# Patient Record
Sex: Female | Born: 1965 | Hispanic: Yes | Marital: Married | Smoking: Never smoker
Health system: Southern US, Community
[De-identification: ages and names within clinical notes are randomized; demographics above are authoritative.]

## PROBLEM LIST (undated history)

## (undated) DIAGNOSIS — Z923 Personal history of irradiation: Secondary | ICD-10-CM

## (undated) DIAGNOSIS — R519 Headache, unspecified: Secondary | ICD-10-CM

## (undated) DIAGNOSIS — E282 Polycystic ovarian syndrome: Secondary | ICD-10-CM

## (undated) DIAGNOSIS — K219 Gastro-esophageal reflux disease without esophagitis: Secondary | ICD-10-CM

## (undated) HISTORY — PX: BREAST LUMPECTOMY: SHX2

## (undated) HISTORY — PX: COLONOSCOPY: SHX174

---

## 2018-09-23 DIAGNOSIS — Z23 Encounter for immunization: Secondary | ICD-10-CM | POA: Diagnosis not present

## 2018-10-18 DIAGNOSIS — Z23 Encounter for immunization: Secondary | ICD-10-CM | POA: Diagnosis not present

## 2019-03-23 DIAGNOSIS — H2513 Age-related nuclear cataract, bilateral: Secondary | ICD-10-CM | POA: Diagnosis not present

## 2019-03-23 DIAGNOSIS — H43393 Other vitreous opacities, bilateral: Secondary | ICD-10-CM | POA: Diagnosis not present

## 2019-03-23 DIAGNOSIS — H16223 Keratoconjunctivitis sicca, not specified as Sjogren's, bilateral: Secondary | ICD-10-CM | POA: Diagnosis not present

## 2019-03-23 DIAGNOSIS — H0100A Unspecified blepharitis right eye, upper and lower eyelids: Secondary | ICD-10-CM | POA: Diagnosis not present

## 2019-04-27 DIAGNOSIS — Z0000 Encounter for general adult medical examination without abnormal findings: Secondary | ICD-10-CM | POA: Diagnosis not present

## 2019-05-20 DIAGNOSIS — M5432 Sciatica, left side: Secondary | ICD-10-CM | POA: Diagnosis not present

## 2019-05-20 DIAGNOSIS — E785 Hyperlipidemia, unspecified: Secondary | ICD-10-CM | POA: Diagnosis not present

## 2019-05-20 DIAGNOSIS — Z0000 Encounter for general adult medical examination without abnormal findings: Secondary | ICD-10-CM | POA: Diagnosis not present

## 2019-12-09 DIAGNOSIS — M5432 Sciatica, left side: Secondary | ICD-10-CM | POA: Diagnosis not present

## 2019-12-31 DIAGNOSIS — M9902 Segmental and somatic dysfunction of thoracic region: Secondary | ICD-10-CM | POA: Diagnosis not present

## 2019-12-31 DIAGNOSIS — M5416 Radiculopathy, lumbar region: Secondary | ICD-10-CM | POA: Diagnosis not present

## 2019-12-31 DIAGNOSIS — M9901 Segmental and somatic dysfunction of cervical region: Secondary | ICD-10-CM | POA: Diagnosis not present

## 2019-12-31 DIAGNOSIS — M9903 Segmental and somatic dysfunction of lumbar region: Secondary | ICD-10-CM | POA: Diagnosis not present

## 2020-01-14 DIAGNOSIS — M9902 Segmental and somatic dysfunction of thoracic region: Secondary | ICD-10-CM | POA: Diagnosis not present

## 2020-01-14 DIAGNOSIS — M5416 Radiculopathy, lumbar region: Secondary | ICD-10-CM | POA: Diagnosis not present

## 2020-01-14 DIAGNOSIS — M9901 Segmental and somatic dysfunction of cervical region: Secondary | ICD-10-CM | POA: Diagnosis not present

## 2020-01-14 DIAGNOSIS — M9903 Segmental and somatic dysfunction of lumbar region: Secondary | ICD-10-CM | POA: Diagnosis not present

## 2020-01-18 DIAGNOSIS — M9903 Segmental and somatic dysfunction of lumbar region: Secondary | ICD-10-CM | POA: Diagnosis not present

## 2020-01-18 DIAGNOSIS — M9901 Segmental and somatic dysfunction of cervical region: Secondary | ICD-10-CM | POA: Diagnosis not present

## 2020-01-18 DIAGNOSIS — M5416 Radiculopathy, lumbar region: Secondary | ICD-10-CM | POA: Diagnosis not present

## 2020-01-18 DIAGNOSIS — M9902 Segmental and somatic dysfunction of thoracic region: Secondary | ICD-10-CM | POA: Diagnosis not present

## 2020-01-21 DIAGNOSIS — M9902 Segmental and somatic dysfunction of thoracic region: Secondary | ICD-10-CM | POA: Diagnosis not present

## 2020-01-21 DIAGNOSIS — M9901 Segmental and somatic dysfunction of cervical region: Secondary | ICD-10-CM | POA: Diagnosis not present

## 2020-01-21 DIAGNOSIS — M5416 Radiculopathy, lumbar region: Secondary | ICD-10-CM | POA: Diagnosis not present

## 2020-01-21 DIAGNOSIS — M9903 Segmental and somatic dysfunction of lumbar region: Secondary | ICD-10-CM | POA: Diagnosis not present

## 2020-01-25 DIAGNOSIS — M9903 Segmental and somatic dysfunction of lumbar region: Secondary | ICD-10-CM | POA: Diagnosis not present

## 2020-01-25 DIAGNOSIS — M9902 Segmental and somatic dysfunction of thoracic region: Secondary | ICD-10-CM | POA: Diagnosis not present

## 2020-01-25 DIAGNOSIS — M9901 Segmental and somatic dysfunction of cervical region: Secondary | ICD-10-CM | POA: Diagnosis not present

## 2020-01-25 DIAGNOSIS — M5416 Radiculopathy, lumbar region: Secondary | ICD-10-CM | POA: Diagnosis not present

## 2020-01-28 DIAGNOSIS — M9901 Segmental and somatic dysfunction of cervical region: Secondary | ICD-10-CM | POA: Diagnosis not present

## 2020-01-28 DIAGNOSIS — M9903 Segmental and somatic dysfunction of lumbar region: Secondary | ICD-10-CM | POA: Diagnosis not present

## 2020-01-28 DIAGNOSIS — M5416 Radiculopathy, lumbar region: Secondary | ICD-10-CM | POA: Diagnosis not present

## 2020-01-28 DIAGNOSIS — M9902 Segmental and somatic dysfunction of thoracic region: Secondary | ICD-10-CM | POA: Diagnosis not present

## 2020-02-04 DIAGNOSIS — M9903 Segmental and somatic dysfunction of lumbar region: Secondary | ICD-10-CM | POA: Diagnosis not present

## 2020-02-04 DIAGNOSIS — M5416 Radiculopathy, lumbar region: Secondary | ICD-10-CM | POA: Diagnosis not present

## 2020-02-04 DIAGNOSIS — M9901 Segmental and somatic dysfunction of cervical region: Secondary | ICD-10-CM | POA: Diagnosis not present

## 2020-02-04 DIAGNOSIS — M9902 Segmental and somatic dysfunction of thoracic region: Secondary | ICD-10-CM | POA: Diagnosis not present

## 2020-02-08 DIAGNOSIS — M9903 Segmental and somatic dysfunction of lumbar region: Secondary | ICD-10-CM | POA: Diagnosis not present

## 2020-02-08 DIAGNOSIS — M5416 Radiculopathy, lumbar region: Secondary | ICD-10-CM | POA: Diagnosis not present

## 2020-02-08 DIAGNOSIS — M9901 Segmental and somatic dysfunction of cervical region: Secondary | ICD-10-CM | POA: Diagnosis not present

## 2020-02-08 DIAGNOSIS — M9902 Segmental and somatic dysfunction of thoracic region: Secondary | ICD-10-CM | POA: Diagnosis not present

## 2020-02-11 DIAGNOSIS — M9901 Segmental and somatic dysfunction of cervical region: Secondary | ICD-10-CM | POA: Diagnosis not present

## 2020-02-11 DIAGNOSIS — M5416 Radiculopathy, lumbar region: Secondary | ICD-10-CM | POA: Diagnosis not present

## 2020-02-11 DIAGNOSIS — M9902 Segmental and somatic dysfunction of thoracic region: Secondary | ICD-10-CM | POA: Diagnosis not present

## 2020-02-11 DIAGNOSIS — M9903 Segmental and somatic dysfunction of lumbar region: Secondary | ICD-10-CM | POA: Diagnosis not present

## 2020-02-12 DIAGNOSIS — M5442 Lumbago with sciatica, left side: Secondary | ICD-10-CM | POA: Diagnosis not present

## 2020-02-15 DIAGNOSIS — M9902 Segmental and somatic dysfunction of thoracic region: Secondary | ICD-10-CM | POA: Diagnosis not present

## 2020-02-15 DIAGNOSIS — M9903 Segmental and somatic dysfunction of lumbar region: Secondary | ICD-10-CM | POA: Diagnosis not present

## 2020-02-15 DIAGNOSIS — M5416 Radiculopathy, lumbar region: Secondary | ICD-10-CM | POA: Diagnosis not present

## 2020-02-15 DIAGNOSIS — M9901 Segmental and somatic dysfunction of cervical region: Secondary | ICD-10-CM | POA: Diagnosis not present

## 2020-02-18 DIAGNOSIS — M5416 Radiculopathy, lumbar region: Secondary | ICD-10-CM | POA: Diagnosis not present

## 2020-02-18 DIAGNOSIS — M9903 Segmental and somatic dysfunction of lumbar region: Secondary | ICD-10-CM | POA: Diagnosis not present

## 2020-02-18 DIAGNOSIS — M9902 Segmental and somatic dysfunction of thoracic region: Secondary | ICD-10-CM | POA: Diagnosis not present

## 2020-02-18 DIAGNOSIS — M9901 Segmental and somatic dysfunction of cervical region: Secondary | ICD-10-CM | POA: Diagnosis not present

## 2020-02-22 DIAGNOSIS — M5416 Radiculopathy, lumbar region: Secondary | ICD-10-CM | POA: Diagnosis not present

## 2020-02-22 DIAGNOSIS — M9901 Segmental and somatic dysfunction of cervical region: Secondary | ICD-10-CM | POA: Diagnosis not present

## 2020-02-22 DIAGNOSIS — M9903 Segmental and somatic dysfunction of lumbar region: Secondary | ICD-10-CM | POA: Diagnosis not present

## 2020-02-22 DIAGNOSIS — M9902 Segmental and somatic dysfunction of thoracic region: Secondary | ICD-10-CM | POA: Diagnosis not present

## 2020-02-25 DIAGNOSIS — M9903 Segmental and somatic dysfunction of lumbar region: Secondary | ICD-10-CM | POA: Diagnosis not present

## 2020-02-25 DIAGNOSIS — M9902 Segmental and somatic dysfunction of thoracic region: Secondary | ICD-10-CM | POA: Diagnosis not present

## 2020-02-25 DIAGNOSIS — M5416 Radiculopathy, lumbar region: Secondary | ICD-10-CM | POA: Diagnosis not present

## 2020-02-25 DIAGNOSIS — M9901 Segmental and somatic dysfunction of cervical region: Secondary | ICD-10-CM | POA: Diagnosis not present

## 2020-02-27 DIAGNOSIS — M5432 Sciatica, left side: Secondary | ICD-10-CM | POA: Diagnosis not present

## 2020-02-27 DIAGNOSIS — M545 Low back pain, unspecified: Secondary | ICD-10-CM | POA: Diagnosis not present

## 2020-03-03 DIAGNOSIS — M9903 Segmental and somatic dysfunction of lumbar region: Secondary | ICD-10-CM | POA: Diagnosis not present

## 2020-03-03 DIAGNOSIS — M9902 Segmental and somatic dysfunction of thoracic region: Secondary | ICD-10-CM | POA: Diagnosis not present

## 2020-03-03 DIAGNOSIS — M9901 Segmental and somatic dysfunction of cervical region: Secondary | ICD-10-CM | POA: Diagnosis not present

## 2020-03-03 DIAGNOSIS — M5416 Radiculopathy, lumbar region: Secondary | ICD-10-CM | POA: Diagnosis not present

## 2020-03-03 DIAGNOSIS — M5136 Other intervertebral disc degeneration, lumbar region: Secondary | ICD-10-CM | POA: Diagnosis not present

## 2020-03-07 DIAGNOSIS — M9901 Segmental and somatic dysfunction of cervical region: Secondary | ICD-10-CM | POA: Diagnosis not present

## 2020-03-07 DIAGNOSIS — M9902 Segmental and somatic dysfunction of thoracic region: Secondary | ICD-10-CM | POA: Diagnosis not present

## 2020-03-07 DIAGNOSIS — M9903 Segmental and somatic dysfunction of lumbar region: Secondary | ICD-10-CM | POA: Diagnosis not present

## 2020-03-07 DIAGNOSIS — M5416 Radiculopathy, lumbar region: Secondary | ICD-10-CM | POA: Diagnosis not present

## 2020-03-10 DIAGNOSIS — M9901 Segmental and somatic dysfunction of cervical region: Secondary | ICD-10-CM | POA: Diagnosis not present

## 2020-03-10 DIAGNOSIS — M9902 Segmental and somatic dysfunction of thoracic region: Secondary | ICD-10-CM | POA: Diagnosis not present

## 2020-03-10 DIAGNOSIS — M9903 Segmental and somatic dysfunction of lumbar region: Secondary | ICD-10-CM | POA: Diagnosis not present

## 2020-03-10 DIAGNOSIS — M5416 Radiculopathy, lumbar region: Secondary | ICD-10-CM | POA: Diagnosis not present

## 2020-03-14 DIAGNOSIS — M9903 Segmental and somatic dysfunction of lumbar region: Secondary | ICD-10-CM | POA: Diagnosis not present

## 2020-03-14 DIAGNOSIS — M9901 Segmental and somatic dysfunction of cervical region: Secondary | ICD-10-CM | POA: Diagnosis not present

## 2020-03-14 DIAGNOSIS — M9902 Segmental and somatic dysfunction of thoracic region: Secondary | ICD-10-CM | POA: Diagnosis not present

## 2020-03-14 DIAGNOSIS — M5416 Radiculopathy, lumbar region: Secondary | ICD-10-CM | POA: Diagnosis not present

## 2020-03-17 DIAGNOSIS — M9901 Segmental and somatic dysfunction of cervical region: Secondary | ICD-10-CM | POA: Diagnosis not present

## 2020-03-17 DIAGNOSIS — M5416 Radiculopathy, lumbar region: Secondary | ICD-10-CM | POA: Diagnosis not present

## 2020-03-17 DIAGNOSIS — M9902 Segmental and somatic dysfunction of thoracic region: Secondary | ICD-10-CM | POA: Diagnosis not present

## 2020-03-17 DIAGNOSIS — M9903 Segmental and somatic dysfunction of lumbar region: Secondary | ICD-10-CM | POA: Diagnosis not present

## 2020-03-24 DIAGNOSIS — M5416 Radiculopathy, lumbar region: Secondary | ICD-10-CM | POA: Diagnosis not present

## 2020-03-24 DIAGNOSIS — M9902 Segmental and somatic dysfunction of thoracic region: Secondary | ICD-10-CM | POA: Diagnosis not present

## 2020-03-24 DIAGNOSIS — M9903 Segmental and somatic dysfunction of lumbar region: Secondary | ICD-10-CM | POA: Diagnosis not present

## 2020-03-24 DIAGNOSIS — M9901 Segmental and somatic dysfunction of cervical region: Secondary | ICD-10-CM | POA: Diagnosis not present

## 2020-03-28 DIAGNOSIS — M9901 Segmental and somatic dysfunction of cervical region: Secondary | ICD-10-CM | POA: Diagnosis not present

## 2020-03-28 DIAGNOSIS — M5416 Radiculopathy, lumbar region: Secondary | ICD-10-CM | POA: Diagnosis not present

## 2020-03-28 DIAGNOSIS — M9903 Segmental and somatic dysfunction of lumbar region: Secondary | ICD-10-CM | POA: Diagnosis not present

## 2020-03-28 DIAGNOSIS — M9902 Segmental and somatic dysfunction of thoracic region: Secondary | ICD-10-CM | POA: Diagnosis not present

## 2020-04-04 DIAGNOSIS — M9902 Segmental and somatic dysfunction of thoracic region: Secondary | ICD-10-CM | POA: Diagnosis not present

## 2020-04-04 DIAGNOSIS — M9901 Segmental and somatic dysfunction of cervical region: Secondary | ICD-10-CM | POA: Diagnosis not present

## 2020-04-04 DIAGNOSIS — M9903 Segmental and somatic dysfunction of lumbar region: Secondary | ICD-10-CM | POA: Diagnosis not present

## 2020-04-04 DIAGNOSIS — M5416 Radiculopathy, lumbar region: Secondary | ICD-10-CM | POA: Diagnosis not present

## 2020-05-02 DIAGNOSIS — M9901 Segmental and somatic dysfunction of cervical region: Secondary | ICD-10-CM | POA: Diagnosis not present

## 2020-05-02 DIAGNOSIS — M9902 Segmental and somatic dysfunction of thoracic region: Secondary | ICD-10-CM | POA: Diagnosis not present

## 2020-05-02 DIAGNOSIS — M9903 Segmental and somatic dysfunction of lumbar region: Secondary | ICD-10-CM | POA: Diagnosis not present

## 2020-05-02 DIAGNOSIS — M5416 Radiculopathy, lumbar region: Secondary | ICD-10-CM | POA: Diagnosis not present

## 2020-05-09 DIAGNOSIS — M9902 Segmental and somatic dysfunction of thoracic region: Secondary | ICD-10-CM | POA: Diagnosis not present

## 2020-05-09 DIAGNOSIS — M9901 Segmental and somatic dysfunction of cervical region: Secondary | ICD-10-CM | POA: Diagnosis not present

## 2020-05-09 DIAGNOSIS — M5416 Radiculopathy, lumbar region: Secondary | ICD-10-CM | POA: Diagnosis not present

## 2020-05-09 DIAGNOSIS — M9903 Segmental and somatic dysfunction of lumbar region: Secondary | ICD-10-CM | POA: Diagnosis not present

## 2020-05-11 DIAGNOSIS — M5416 Radiculopathy, lumbar region: Secondary | ICD-10-CM | POA: Diagnosis not present

## 2020-05-11 DIAGNOSIS — G43009 Migraine without aura, not intractable, without status migrainosus: Secondary | ICD-10-CM | POA: Diagnosis not present

## 2020-06-09 DIAGNOSIS — U071 COVID-19: Secondary | ICD-10-CM | POA: Diagnosis not present

## 2020-06-09 DIAGNOSIS — R059 Cough, unspecified: Secondary | ICD-10-CM | POA: Diagnosis not present

## 2020-06-27 DIAGNOSIS — H16223 Keratoconjunctivitis sicca, not specified as Sjogren's, bilateral: Secondary | ICD-10-CM | POA: Diagnosis not present

## 2020-06-27 DIAGNOSIS — H5203 Hypermetropia, bilateral: Secondary | ICD-10-CM | POA: Diagnosis not present

## 2020-06-27 DIAGNOSIS — H43393 Other vitreous opacities, bilateral: Secondary | ICD-10-CM | POA: Diagnosis not present

## 2020-06-27 DIAGNOSIS — H524 Presbyopia: Secondary | ICD-10-CM | POA: Diagnosis not present

## 2020-06-27 DIAGNOSIS — H2513 Age-related nuclear cataract, bilateral: Secondary | ICD-10-CM | POA: Diagnosis not present

## 2020-06-27 DIAGNOSIS — H52203 Unspecified astigmatism, bilateral: Secondary | ICD-10-CM | POA: Diagnosis not present

## 2020-07-01 DIAGNOSIS — R0789 Other chest pain: Secondary | ICD-10-CM | POA: Diagnosis not present

## 2020-07-01 DIAGNOSIS — Z13220 Encounter for screening for lipoid disorders: Secondary | ICD-10-CM | POA: Diagnosis not present

## 2020-07-01 DIAGNOSIS — Z0000 Encounter for general adult medical examination without abnormal findings: Secondary | ICD-10-CM | POA: Diagnosis not present

## 2020-07-26 ENCOUNTER — Other Ambulatory Visit: Payer: Self-pay | Admitting: Family Medicine

## 2020-07-26 DIAGNOSIS — Z1231 Encounter for screening mammogram for malignant neoplasm of breast: Secondary | ICD-10-CM

## 2020-09-16 ENCOUNTER — Other Ambulatory Visit: Payer: Self-pay

## 2020-09-16 ENCOUNTER — Ambulatory Visit
Admission: RE | Admit: 2020-09-16 | Discharge: 2020-09-16 | Disposition: A | Discharge status: Home / Self Care | Payer: Self-pay | Source: Ambulatory Visit | Attending: Family Medicine | Admitting: Family Medicine

## 2020-09-16 DIAGNOSIS — Z1231 Encounter for screening mammogram for malignant neoplasm of breast: Secondary | ICD-10-CM

## 2020-09-21 ENCOUNTER — Other Ambulatory Visit: Payer: Self-pay | Admitting: Family Medicine

## 2020-09-21 ENCOUNTER — Other Ambulatory Visit: Payer: Self-pay | Admitting: Internal Medicine

## 2020-09-21 DIAGNOSIS — R928 Other abnormal and inconclusive findings on diagnostic imaging of breast: Secondary | ICD-10-CM

## 2020-11-21 ENCOUNTER — Other Ambulatory Visit: Payer: Self-pay

## 2020-12-15 ENCOUNTER — Other Ambulatory Visit: Payer: Self-pay

## 2021-01-18 ENCOUNTER — Other Ambulatory Visit: Payer: Self-pay | Admitting: Family Medicine

## 2021-01-18 ENCOUNTER — Ambulatory Visit
Admission: RE | Admit: 2021-01-18 | Discharge: 2021-01-18 | Disposition: A | Discharge status: Home / Self Care | Payer: Self-pay | Source: Ambulatory Visit | Attending: Family Medicine | Admitting: Family Medicine

## 2021-01-18 ENCOUNTER — Ambulatory Visit
Admission: RE | Admit: 2021-01-18 | Discharge: 2021-01-18 | Disposition: A | Discharge status: Home / Self Care | Payer: 59 | Source: Ambulatory Visit | Attending: Family Medicine | Admitting: Family Medicine

## 2021-01-18 DIAGNOSIS — N6489 Other specified disorders of breast: Secondary | ICD-10-CM

## 2021-01-18 DIAGNOSIS — R928 Other abnormal and inconclusive findings on diagnostic imaging of breast: Secondary | ICD-10-CM

## 2021-01-18 DIAGNOSIS — R921 Mammographic calcification found on diagnostic imaging of breast: Secondary | ICD-10-CM

## 2021-01-26 ENCOUNTER — Ambulatory Visit
Admission: RE | Admit: 2021-01-26 | Discharge: 2021-01-26 | Disposition: A | Discharge status: Home / Self Care | Payer: 59 | Source: Ambulatory Visit | Attending: Family Medicine | Admitting: Family Medicine

## 2021-01-26 DIAGNOSIS — R928 Other abnormal and inconclusive findings on diagnostic imaging of breast: Secondary | ICD-10-CM

## 2021-01-26 DIAGNOSIS — R921 Mammographic calcification found on diagnostic imaging of breast: Secondary | ICD-10-CM

## 2021-01-26 DIAGNOSIS — N6489 Other specified disorders of breast: Secondary | ICD-10-CM

## 2021-01-26 HISTORY — PX: BREAST BIOPSY: SHX20

## 2021-02-01 ENCOUNTER — Encounter: Payer: Self-pay | Admitting: Adult Health

## 2021-02-01 DIAGNOSIS — C50912 Malignant neoplasm of unspecified site of left female breast: Secondary | ICD-10-CM | POA: Insufficient documentation

## 2021-02-03 DIAGNOSIS — C801 Malignant (primary) neoplasm, unspecified: Secondary | ICD-10-CM

## 2021-02-03 HISTORY — DX: Malignant (primary) neoplasm, unspecified: C80.1

## 2021-02-07 ENCOUNTER — Telehealth: Payer: Self-pay | Admitting: Hematology and Oncology

## 2021-02-07 NOTE — Telephone Encounter (Signed)
Scheduled appt per 1/20 referral. Pt is aware of appt date and time. Pt is aware to arrive 15 mins prior to appt time.

## 2021-02-08 ENCOUNTER — Ambulatory Visit: Payer: Self-pay | Admitting: General Surgery

## 2021-02-08 ENCOUNTER — Other Ambulatory Visit: Payer: Self-pay | Admitting: *Deleted

## 2021-02-08 DIAGNOSIS — D0512 Intraductal carcinoma in situ of left breast: Secondary | ICD-10-CM

## 2021-02-09 ENCOUNTER — Telehealth: Payer: Self-pay | Admitting: Radiation Oncology

## 2021-02-09 ENCOUNTER — Other Ambulatory Visit: Payer: Self-pay | Admitting: General Surgery

## 2021-02-09 DIAGNOSIS — D0512 Intraductal carcinoma in situ of left breast: Secondary | ICD-10-CM

## 2021-02-09 NOTE — Telephone Encounter (Signed)
Called patient to schedule a consultation w. Dr. Squire. No answer, LVM for a return call.  

## 2021-02-10 ENCOUNTER — Telehealth: Payer: Self-pay | Admitting: Radiation Oncology

## 2021-02-10 NOTE — Telephone Encounter (Signed)
Called patient to schedule a consultation w. Dr. Squire. No answer, LVM for a return call.  

## 2021-02-13 ENCOUNTER — Telehealth: Payer: Self-pay | Admitting: Radiation Oncology

## 2021-02-13 NOTE — Telephone Encounter (Signed)
Called patient to schedule a consultation w. Dr. Squire. No answer, LVM for a return call.  

## 2021-02-13 NOTE — Telephone Encounter (Signed)
Called spouse regarding the patient. No answer, LVM for a return call to schedule a consultation w. Dr. Isidore Moos.

## 2021-02-15 ENCOUNTER — Encounter (HOSPITAL_BASED_OUTPATIENT_CLINIC_OR_DEPARTMENT_OTHER): Payer: Self-pay | Admitting: General Surgery

## 2021-02-16 ENCOUNTER — Encounter (HOSPITAL_BASED_OUTPATIENT_CLINIC_OR_DEPARTMENT_OTHER): Payer: Self-pay | Admitting: General Surgery

## 2021-02-16 ENCOUNTER — Other Ambulatory Visit: Payer: Self-pay

## 2021-02-17 MED ORDER — CHLORHEXIDINE GLUCONATE CLOTH 2 % EX PADS: 6.0000 | MEDICATED_PAD | Freq: Once | CUTANEOUS | Status: DC

## 2021-02-17 NOTE — Progress Notes (Signed)
° ° ° ° °  Enhanced Recovery after Surgery for Orthopedics Enhanced Recovery after Surgery is a protocol used to improve the stress on your body and your recovery after surgery.  Patient Instructions  The night before surgery:  No food after midnight. ONLY clear liquids after midnight  The day of surgery (if you do NOT have diabetes):  Drink ONE (1) Pre-Surgery Clear Ensure as directed.   This drink was given to you during your hospital  pre-op appointment visit. The pre-op nurse will instruct you on the time to drink the  Pre-Surgery Ensure depending on your surgery time. Finish the drink at the designated time by the pre-op nurse.  Nothing else to drink after completing the  Pre-Surgery Clear Ensure.  The day of surgery (if you have diabetes): Drink ONE (1) Gatorade 2 (G2) as directed. This drink was given to you during your hospital  pre-op appointment visit.  The pre-op nurse will instruct you on the time to drink the   Gatorade 2 (G2) depending on your surgery time. Color of the Gatorade may vary. Red is not allowed. Nothing else to drink after completing the  Gatorade 2 (G2).         If you have questions, please contact your surgeons office.   Patient given soap and instructions, verbalized understanding.

## 2021-02-20 NOTE — Progress Notes (Signed)
Radiation Oncology         [phone]) [phone] ________________________________  Name: Frances Patton        MRN: [id number]  Date of Service: 02/22/2021 DOB: 1966/01/07  QA:[id number], Anastasia Pall, MD  Jovita Kussmaul, MD     REFERRING PHYSICIAN: Autumn Messing III, MD   DIAGNOSIS: The encounter diagnosis was Malignant neoplasm of left breast in female, estrogen receptor positive, unspecified site of breast ([place]).   HISTORY OF PRESENT ILLNESS: Frances Patton is a 56 y.o. female seen for a new diagnosis of left breast cancer. She had screening mammogram in September 2022 that showed calcifications in the left breast; no abnormality was seen in the right breast.  She returned for diagnostic imaging on 01/18/2021.  The patient was found to have an area of amorphous calcifications in the lateral left breast measuring up to 1.4 cm.  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  She is scheduled to undergo left lumpectomy on 02/24/2021 and is seen to discuss treatment recommendations of her cancer.    PREVIOUS RADIATION THERAPY: No   PAST MEDICAL HISTORY:  Past Medical History:  Diagnosis Date   Cancer ([place]) 02/03/2021   Left breast DCIS   GERD (gastroesophageal reflux disease)    Headache    PCOS (polycystic ovarian syndrome)        PAST SURGICAL HISTORY: Past Surgical History:  Procedure Laterality Date   BREAST BIOPSY Left 01/26/2021   stereo biopsy/ x clip/ path pending   COLONOSCOPY       FAMILY HISTORY:  Family History  Problem Relation Age of Onset   Breast cancer Neg Hx      SOCIAL HISTORY:  reports that she has never smoked. She has never used smokeless tobacco. She reports that she does not drink alcohol and does not use drugs.  The patient is married and lives in [place].  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 She's originally from the [place]    ALLERGIES: Latex   MEDICATIONS:  Current Outpatient Medications  Medication Sig Dispense Refill   b complex vitamins capsule Take by mouth daily.     magnesium 30 MG tablet Take by mouth 2 (two) times daily.     pyridOXINE (VITAMIN B-6) 100 MG tablet Take 100 mg by mouth daily.     VITAMIN D PO Take by mouth.     No current facility-administered medications for this visit.   Facility-Administered Medications Ordered in Other Visits  Medication Dose Route Frequency Provider Last Rate Last Admin   Chlorhexidine Gluconate Cloth 2 % PADS 6 each  6 each Topical Once Autumn Messing III, MD       And   Chlorhexidine Gluconate Cloth 2 % PADS 6 each  6 each Topical Once Autumn Messing III, MD         REVIEW OF SYSTEMS: On review of systems, the patient reports that she is doing okay but has had some soreness in her breast since her biopsy.     PHYSICAL EXAM:  Wt Readings from Last 3 Encounters:  No data found for Wt   Temp Readings from Last 3 Encounters:  No data found for Temp   BP Readings from Last 3 Encounters:  No data found for BP   Pulse Readings from Last 3 Encounters:  No data found for  Pulse    In general this is a well appearing Asian female in no acute distress. She's alert and oriented x4 and appropriate throughout the examination. Cardiopulmonary assessment is negative for acute distress and she exhibits normal effort. Bilateral breast exam is deferred.    ECOG = 0  0 - Asymptomatic (Fully active, able to carry on all predisease activities without restriction)  1 - Symptomatic but completely ambulatory (Restricted in physically strenuous activity but ambulatory and able to carry out work of a light or sedentary nature. For example, light housework, office work)  2 - Symptomatic, <50% in bed during the day (Ambulatory and capable of all self care but unable to carry out any work activities. Up  and about more than 50% of waking hours)  3 - Symptomatic, >50% in bed, but not bedbound (Capable of only limited self-care, confined to bed or chair 50% or more of waking hours)  4 - Bedbound (Completely disabled. Cannot carry on any self-care. Totally confined to bed or chair)  5 - Death   Eustace Pen MM, Creech RH, Tormey DC, et al. [phone]). "Toxicity and response criteria of the [facility]". [place] Oncol. 5 (6): 649-55    LABORATORY DATA:  No results found for: WBC, HGB, HCT, MCV, PLT No results found for: NA, K, CL, CO2 No results found for: ALT, AST, GGT, ALKPHOS, BILITOT    RADIOGRAPHY: MM CLIP PLACEMENT LEFT  Result Date: 01/26/2021 CLINICAL DATA:  Post stereotactic guided biopsy of calcifications with associated distortion in the lower outer left breast. EXAM: 3D DIAGNOSTIC LEFT MAMMOGRAM POST STEREOTACTIC BIOPSY COMPARISON:  Previous exam(s). FINDINGS: 3D Mammographic images were obtained following stereotactic guided biopsy of calcifications with associated distortion in the lower outer left breast. An X shaped biopsy marking clip is present at the site of the biopsied calcifications with associated distortion in the lower outer left breast. A small post biopsy hematoma is present. IMPRESSION: 1. X shaped biopsy marking clip at site of biopsied calcifications with associated distortion in the lower outer left breast. 2.  Small post biopsy hematoma. Final Assessment: Post Procedure Mammograms for Marker Placement Electronically Signed   By: Everlean Alstrom M.D.   On: 01/26/2021 11:53  MM LT BREAST BX W LOC DEV 1ST LESION IMAGE BX SPEC STEREO GUIDE  Addendum Date: 01/31/2021   ADDENDUM REPORT: 01/31/2021 13:27 ADDENDUM: Pathology revealed INTERMEDIATE GRADE DUCTAL CARCINOMA IN SITU with CENTRAL (COMEDO) NECROSIS AND CALCIFICATIONS of the LEFT breast, lateral, (x clip). This was found to be concordant by Dr. Everlean Alstrom. Pathology results were discussed with  the patient by telephone. The patient reported doing well after the biopsy with tenderness at the site. Post biopsy instructions and care were reviewed and questions were answered. The patient was encouraged to call [facility] for any additional concerns. My direct phone number was provided. Surgical consultation has been arranged with Dr. Autumn Messing at [facility] on February 01, 2021. Pathology results reported by Terie Purser, RN on 01/31/2021. Electronically Signed   By: Everlean Alstrom M.D.   On: 01/31/2021 13:27   Result Date: 01/31/2021 CLINICAL DATA:  56 year old female presents for stereotactic guided biopsy of a 1.4 cm group of calcifications with associated distortion in the lateral left breast. EXAM: LEFT BREAST STEREOTACTIC CORE NEEDLE BIOPSY COMPARISON:  Previous exams. FINDINGS: The patient and I discussed the procedure of stereotactic-guided biopsy including benefits and alternatives. We discussed the high likelihood of a successful procedure.  We discussed the risks of the procedure including infection, bleeding, tissue injury, clip migration, and inadequate sampling. Informed written consent was given. The usual time out protocol was performed immediately prior to the procedure. Using sterile technique and 1% Lidocaine as local anesthetic, under stereotactic guidance, a 9 gauge vacuum assisted device was used to perform core needle biopsy of the calcifications with associated distortion in the lateral left breast using a lateral to medial approach. Specimen radiograph was performed showing the presence of calcifications. Specimens with calcifications are identified for pathology. Lesion quadrant: Lower outer At the conclusion of the procedure, an X shaped tissue marker clip was deployed into the biopsy cavity. Follow-up 2-view mammogram was performed and dictated separately. IMPRESSION: Stereotactic-guided biopsy of the calcifications in the lateral left  breast. No apparent complications. Electronically Signed: By: Everlean Alstrom M.D. On: 01/26/2021 11:50      IMPRESSION/PLAN: 1. 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 Lisbeth Renshaw discusses the pathology findings and reviews the nature of  noninvasive breast disease.  Dr. Lisbeth Renshaw agrees with plans for her upcoming  left lumpectomy.  Dr. Lisbeth Renshaw discusses the rationale for external radiotherapy to the breast  to reduce risks of local recurrence followed by antiestrogen therapy. We discussed the risks, benefits, short, and long term effects of radiotherapy, as well as the curative intent, and the patient is interested in proceeding. Dr. Lisbeth Renshaw discusses the delivery and logistics of radiotherapy and anticipates a course of 4 weeks of radiotherapy to the left breast with deep inspiration breath-hold technique. We will see her back a few weeks after surgery to discuss the simulation process and anticipate we starting radiotherapy about 4-6 weeks after surgery.    In a visit lasting 60 minutes, greater than 50% of the time was spent face to face reviewing her case, as well as in preparation of, discussing, and coordinating the patient's care.  The above documentation reflects my direct findings during this shared patient visit. Please see the separate note by Dr. Lisbeth Renshaw on this date for the remainder of the patient's plan of care.    Carola Rhine, [facility]    **Disclaimer: This note was dictated with voice recognition software. Similar sounding words can inadvertently be transcribed and this note may contain transcription errors which may not have been corrected upon publication of note.**

## 2021-02-20 NOTE — Progress Notes (Signed)
New Breast Cancer Diagnosis: Left Breast  Did patient present with symptoms (if so, please note symptoms) or screening mammography?:Screening Calcifications    Location and Extent of disease :left breast. Located in the lateral left breast, measured 1.4 cm in greatest dimension. Adenopathy no.  Histology per Pathology Report: grade Intermediate, DCIS  Receptor Status: ER(positive), PR (positive), Her2-neu (), Ki-(%)  Surgeon and surgical plan, if any:  Dr. Marlou Starks -Left Breast Lumpectomy with radioactive seed localization 02/24/2021   Medical oncologist, treatment if any:   Dr. Chryl Heck 02/27/2021   Family History of Breast/Ovarian/Prostate Cancer: Mom had breast cancer.  Dad had prostate cancer.  Lymphedema issues, if any: No     Pain issues, if any: Notes some tenderness in her left breast.    SAFETY ISSUES: Prior radiation? No Pacemaker/ICD? No Possible current pregnancy? Postmenopausal Is the patient on methotrexate? No  Current Complaints / other details:

## 2021-02-22 ENCOUNTER — Ambulatory Visit
Admission: RE | Admit: 2021-02-22 | Discharge: 2021-02-22 | Disposition: A | Discharge status: Home / Self Care | Payer: 59 | Source: Ambulatory Visit | Attending: Radiation Oncology | Admitting: Radiation Oncology

## 2021-02-22 ENCOUNTER — Other Ambulatory Visit: Payer: Self-pay

## 2021-02-22 ENCOUNTER — Encounter: Payer: Self-pay | Admitting: Radiation Oncology

## 2021-02-22 VITALS — BP 136/65 | HR 76 | Temp 97.7°F | Resp 20 | Ht 66.0 in | Wt 173.8 lb

## 2021-02-22 DIAGNOSIS — E282 Polycystic ovarian syndrome: Secondary | ICD-10-CM | POA: Insufficient documentation

## 2021-02-22 DIAGNOSIS — Z170 Estrogen receptor positive status [ER+]: Secondary | ICD-10-CM

## 2021-02-22 DIAGNOSIS — K219 Gastro-esophageal reflux disease without esophagitis: Secondary | ICD-10-CM | POA: Diagnosis not present

## 2021-02-22 DIAGNOSIS — C50912 Malignant neoplasm of unspecified site of left female breast: Secondary | ICD-10-CM | POA: Insufficient documentation

## 2021-02-22 NOTE — Addendum Note (Signed)
Encounter addended by: Cori Razor, RN on: 02/22/2021 9:31 AM  Actions taken: Flowsheet accepted

## 2021-02-23 ENCOUNTER — Ambulatory Visit
Admission: RE | Admit: 2021-02-23 | Discharge: 2021-02-23 | Disposition: A | Discharge status: Home / Self Care | Payer: 59 | Source: Ambulatory Visit | Attending: General Surgery | Admitting: General Surgery

## 2021-02-23 DIAGNOSIS — D0512 Intraductal carcinoma in situ of left breast: Secondary | ICD-10-CM

## 2021-02-24 ENCOUNTER — Encounter (HOSPITAL_BASED_OUTPATIENT_CLINIC_OR_DEPARTMENT_OTHER)
Admission: RE | Disposition: A | Discharge status: Home / Self Care | Payer: Self-pay | Source: Home / Self Care | Attending: General Surgery

## 2021-02-24 ENCOUNTER — Other Ambulatory Visit: Payer: Self-pay

## 2021-02-24 ENCOUNTER — Encounter (HOSPITAL_BASED_OUTPATIENT_CLINIC_OR_DEPARTMENT_OTHER): Payer: Self-pay | Admitting: General Surgery

## 2021-02-24 ENCOUNTER — Ambulatory Visit (HOSPITAL_BASED_OUTPATIENT_CLINIC_OR_DEPARTMENT_OTHER): Payer: 59 | Admitting: Certified Registered"

## 2021-02-24 ENCOUNTER — Ambulatory Visit
Admission: RE | Admit: 2021-02-24 | Discharge: 2021-02-24 | Disposition: A | Discharge status: Home / Self Care | Payer: 59 | Source: Ambulatory Visit | Attending: General Surgery | Admitting: General Surgery

## 2021-02-24 ENCOUNTER — Ambulatory Visit (HOSPITAL_BASED_OUTPATIENT_CLINIC_OR_DEPARTMENT_OTHER)
Admission: RE | Admit: 2021-02-24 | Discharge: 2021-02-24 | Disposition: A | Discharge status: Home / Self Care | Payer: 59 | Attending: General Surgery | Admitting: General Surgery

## 2021-02-24 DIAGNOSIS — D0512 Intraductal carcinoma in situ of left breast: Secondary | ICD-10-CM

## 2021-02-24 DIAGNOSIS — Z803 Family history of malignant neoplasm of breast: Secondary | ICD-10-CM | POA: Diagnosis not present

## 2021-02-24 DIAGNOSIS — Z170 Estrogen receptor positive status [ER+]: Secondary | ICD-10-CM | POA: Insufficient documentation

## 2021-02-24 DIAGNOSIS — K219 Gastro-esophageal reflux disease without esophagitis: Secondary | ICD-10-CM | POA: Diagnosis not present

## 2021-02-24 HISTORY — DX: Gastro-esophageal reflux disease without esophagitis: K21.9

## 2021-02-24 HISTORY — PX: BREAST LUMPECTOMY WITH RADIOACTIVE SEED LOCALIZATION: SHX6424

## 2021-02-24 HISTORY — DX: Headache, unspecified: R51.9

## 2021-02-24 HISTORY — DX: Polycystic ovarian syndrome: E28.2

## 2021-02-24 SURGERY — BREAST LUMPECTOMY WITH RADIOACTIVE SEED LOCALIZATION
Anesthesia: General | Site: Breast | Laterality: Left

## 2021-02-24 MED ORDER — EPHEDRINE SULFATE (PRESSORS) 50 MG/ML IJ SOLN
INTRAMUSCULAR | Status: DC | PRN
Start: 2021-02-24 — End: 2021-02-24
  Administered 2021-02-24 (×2): 5 mg via INTRAVENOUS

## 2021-02-24 MED ORDER — CEFAZOLIN SODIUM-DEXTROSE 2-4 GM/100ML-% IV SOLN
2.0000 g | INTRAVENOUS | Status: AC
  Administered 2021-02-24: 2 g via INTRAVENOUS

## 2021-02-24 MED ORDER — CEFAZOLIN SODIUM-DEXTROSE 2-4 GM/100ML-% IV SOLN
INTRAVENOUS | Status: AC
  Filled 2021-02-24: qty 100

## 2021-02-24 MED ORDER — ONDANSETRON HCL 4 MG/2ML IJ SOLN
INTRAMUSCULAR | Status: AC
  Filled 2021-02-24: qty 2

## 2021-02-24 MED ORDER — FENTANYL CITRATE (PF) 100 MCG/2ML IJ SOLN
25.0000 ug | INTRAMUSCULAR | Status: DC | PRN
  Administered 2021-02-24: 25 ug via INTRAVENOUS

## 2021-02-24 MED ORDER — FENTANYL CITRATE (PF) 100 MCG/2ML IJ SOLN
INTRAMUSCULAR | Status: DC | PRN
  Administered 2021-02-24 (×2): 25 ug via INTRAVENOUS

## 2021-02-24 MED ORDER — GABAPENTIN 300 MG PO CAPS
300.0000 mg | ORAL_CAPSULE | ORAL | Status: AC
  Administered 2021-02-24: 300 mg via ORAL

## 2021-02-24 MED ORDER — SCOPOLAMINE 1 MG/3DAYS TD PT72
1.0000 | MEDICATED_PATCH | TRANSDERMAL | Status: DC
  Administered 2021-02-24: 1.5 mg via TRANSDERMAL

## 2021-02-24 MED ORDER — ONDANSETRON HCL 4 MG/2ML IJ SOLN: 4.0000 mg | Freq: Four times a day (QID) | INTRAMUSCULAR | Status: DC | PRN

## 2021-02-24 MED ORDER — DEXAMETHASONE SODIUM PHOSPHATE 10 MG/ML IJ SOLN
INTRAMUSCULAR | Status: AC
  Filled 2021-02-24: qty 1

## 2021-02-24 MED ORDER — LACTATED RINGERS IV SOLN: INTRAVENOUS | Status: DC

## 2021-02-24 MED ORDER — ONDANSETRON HCL 4 MG/2ML IJ SOLN
INTRAMUSCULAR | Status: DC | PRN
  Administered 2021-02-24: 4 mg via INTRAVENOUS

## 2021-02-24 MED ORDER — MIDAZOLAM HCL 5 MG/5ML IJ SOLN
INTRAMUSCULAR | Status: DC | PRN
  Administered 2021-02-24: 2 mg via INTRAVENOUS

## 2021-02-24 MED ORDER — BUPIVACAINE-EPINEPHRINE (PF) 0.25% -1:200000 IJ SOLN
INTRAMUSCULAR | Status: DC | PRN
  Administered 2021-02-24: 18 mL

## 2021-02-24 MED ORDER — FENTANYL CITRATE (PF) 100 MCG/2ML IJ SOLN
INTRAMUSCULAR | Status: AC
  Filled 2021-02-24: qty 2

## 2021-02-24 MED ORDER — OXYCODONE HCL 5 MG PO TABS: 5.0000 mg | ORAL_TABLET | ORAL | 0 refills | Status: DC | PRN

## 2021-02-24 MED ORDER — MIDAZOLAM HCL 2 MG/2ML IJ SOLN
INTRAMUSCULAR | Status: AC
  Filled 2021-02-24: qty 2

## 2021-02-24 MED ORDER — CELECOXIB 200 MG PO CAPS
ORAL_CAPSULE | ORAL | Status: AC
  Filled 2021-02-24: qty 1

## 2021-02-24 MED ORDER — ACETAMINOPHEN 500 MG PO TABS
1000.0000 mg | ORAL_TABLET | ORAL | Status: AC
  Administered 2021-02-24: 1000 mg via ORAL

## 2021-02-24 MED ORDER — EPHEDRINE 5 MG/ML INJ
INTRAVENOUS | Status: AC
  Filled 2021-02-24: qty 5

## 2021-02-24 MED ORDER — CELECOXIB 200 MG PO CAPS
200.0000 mg | ORAL_CAPSULE | ORAL | Status: AC
  Administered 2021-02-24: 200 mg via ORAL

## 2021-02-24 MED ORDER — OXYCODONE HCL 5 MG PO TABS: 5.0000 mg | ORAL_TABLET | Freq: Once | ORAL | Status: DC | PRN

## 2021-02-24 MED ORDER — OXYCODONE HCL 5 MG/5ML PO SOLN: 5.0000 mg | Freq: Once | ORAL | Status: DC | PRN

## 2021-02-24 MED ORDER — PROPOFOL 10 MG/ML IV BOLUS
INTRAVENOUS | Status: AC
  Filled 2021-02-24: qty 20

## 2021-02-24 MED ORDER — LIDOCAINE 2% (20 MG/ML) 5 ML SYRINGE
INTRAMUSCULAR | Status: AC
  Filled 2021-02-24: qty 5

## 2021-02-24 MED ORDER — GABAPENTIN 300 MG PO CAPS
ORAL_CAPSULE | ORAL | Status: AC
  Filled 2021-02-24: qty 1

## 2021-02-24 MED ORDER — PROPOFOL 10 MG/ML IV BOLUS
INTRAVENOUS | Status: DC | PRN
  Administered 2021-02-24: 40 mg via INTRAVENOUS
  Administered 2021-02-24: 160 mg via INTRAVENOUS

## 2021-02-24 MED ORDER — ACETAMINOPHEN 500 MG PO TABS
ORAL_TABLET | ORAL | Status: AC
  Filled 2021-02-24: qty 2

## 2021-02-24 MED ORDER — LIDOCAINE HCL (CARDIAC) PF 100 MG/5ML IV SOSY
PREFILLED_SYRINGE | INTRAVENOUS | Status: DC | PRN
  Administered 2021-02-24: 60 mg via INTRAVENOUS

## 2021-02-24 MED ORDER — DEXAMETHASONE SODIUM PHOSPHATE 10 MG/ML IJ SOLN
INTRAMUSCULAR | Status: DC | PRN
  Administered 2021-02-24: 10 mg via INTRAVENOUS

## 2021-02-24 SURGICAL SUPPLY — 33 items
ADH SKN CLS APL DERMABOND .7 (GAUZE/BANDAGES/DRESSINGS) ×1
APL PRP STRL LF DISP 70% ISPRP (MISCELLANEOUS) ×1
APPLIER CLIP 9.375 MED OPEN (MISCELLANEOUS)
APR CLP MED 9.3 20 MLT OPN (MISCELLANEOUS)
BLADE SURG 15 STRL LF DISP TIS (BLADE) ×1 IMPLANT
BLADE SURG 15 STRL SS (BLADE) ×2
CHLORAPREP W/TINT 26 (MISCELLANEOUS) ×2 IMPLANT
CLIP APPLIE 9.375 MED OPEN (MISCELLANEOUS) IMPLANT
COVER BACK TABLE 60X90IN (DRAPES) ×2 IMPLANT
COVER MAYO STAND STRL (DRAPES) ×2 IMPLANT
COVER PROBE W GEL 5X96 (DRAPES) ×2 IMPLANT
DERMABOND ADVANCED (GAUZE/BANDAGES/DRESSINGS) ×1
DERMABOND ADVANCED .7 DNX12 (GAUZE/BANDAGES/DRESSINGS) ×1 IMPLANT
DRAPE LAPAROSCOPIC ABDOMINAL (DRAPES) ×2 IMPLANT
DRAPE UTILITY XL STRL (DRAPES) ×2 IMPLANT
ELECT COATED BLADE 2.86 ST (ELECTRODE) ×2 IMPLANT
ELECT REM PT RETURN 9FT ADLT (ELECTROSURGICAL) ×2
ELECTRODE REM PT RTRN 9FT ADLT (ELECTROSURGICAL) ×1 IMPLANT
GLOVE SURG ENC MOIS LTX SZ7.5 (GLOVE) ×4 IMPLANT
GOWN STRL REUS W/ TWL LRG LVL3 (GOWN DISPOSABLE) ×2 IMPLANT
GOWN STRL REUS W/TWL LRG LVL3 (GOWN DISPOSABLE) ×4
KIT MARKER MARGIN INK (KITS) ×2 IMPLANT
NDL HYPO 25X1 1.5 SAFETY (NEEDLE) IMPLANT
NEEDLE HYPO 25X1 1.5 SAFETY (NEEDLE) ×2 IMPLANT
PACK BASIN DAY SURGERY FS (CUSTOM PROCEDURE TRAY) ×2 IMPLANT
PENCIL SMOKE EVACUATOR (MISCELLANEOUS) ×2 IMPLANT
SLEEVE SCD COMPRESS KNEE MED (STOCKING) ×2 IMPLANT
SPONGE T-LAP 18X18 ~~LOC~~+RFID (SPONGE) ×2 IMPLANT
SUT MON AB 4-0 PC3 18 (SUTURE) ×2 IMPLANT
SUT VICRYL 3-0 CR8 SH (SUTURE) ×2 IMPLANT
SYR CONTROL 10ML LL (SYRINGE) ×1 IMPLANT
TOWEL GREEN STERILE FF (TOWEL DISPOSABLE) ×2 IMPLANT
TRAY FAXITRON CT DISP (TRAY / TRAY PROCEDURE) ×2 IMPLANT

## 2021-02-24 NOTE — Op Note (Signed)
02/24/2021  2:06 PM  PATIENT:  Frances Patton  56 y.o. female  PRE-OPERATIVE DIAGNOSIS:  LEFT BREAST DCIS  POST-OPERATIVE DIAGNOSIS:  LEFT BREAST DCIS  PROCEDURE:  Procedure(s): LEFT BREAST LUMPECTOMY WITH RADIOACTIVE SEED LOCALIZATION (Left)  SURGEON:  Surgeon(s) and Role:    * Jovita Kussmaul, MD - Primary  PHYSICIAN ASSISTANT:   ASSISTANTS: none   ANESTHESIA:   local and general  EBL:  minimal   BLOOD ADMINISTERED:none  DRAINS: none   LOCAL MEDICATIONS USED:  MARCAINE     SPECIMEN:  Source of Specimen:  left breast tissue  DISPOSITION OF SPECIMEN:  PATHOLOGY  COUNTS:  YES  TOURNIQUET:  * No tourniquets in log *  DICTATION: .Dragon Dictation  After informed consent was obtained the patient was brought to the operating room and placed in the supine position on the operating table.  After adequate induction of general anesthesia the patient's left breast was prepped with ChloraPrep, allowed to dry, and draped in usual sterile manner.  An appropriate timeout was performed.  Previously an I-125 seed was placed in the lower outer quadrant of the left breast to mark an area of ductal carcinoma in situ.  The neoprobe was set to I-125 in the area of radioactivity was readily identified.  The area around this was infiltrated with quarter percent Marcaine.  A curvilinear incision was made along the lower and outer edge of the areola of the left breast with a 15 blade knife.  The incision was carried through the skin and subcutaneous tissue sharply with the electrocautery.  Dissection was then carried out through the lower and outer quadrant between the breast tissue and the subcutaneous fat and skin.  Once this dissection was well beyond the area of the cancer then I then removed a circular portion of breast tissue sharply with the electrocautery around the radioactive seed while checking the area of radioactivity frequently.  Once the specimen was removed it was oriented with the  appropriate paint colors.  A specimen radiograph was obtained that showed the clip and seed to be near the center of the specimen.  The specimen was then sent to pathology for further evaluation.  Hemostasis was achieved using the Bovie electrocautery.  The cavity was irrigated with saline and infiltrated with more quarter percent Marcaine.  The deep layer of the incision was closed with interrupted 3-0 Vicryl stitches.  The skin was then closed with interrupted 4-0 Monocryl subcuticular stitches.  Dermabond dressings were applied.  The patient tolerated the procedure well.  At the end of the case all needle sponge and instrument counts were correct.  The patient was then awakened and taken to recovery in stable condition.  PLAN OF CARE: Discharge to home after PACU  PATIENT DISPOSITION:  PACU - hemodynamically stable.   Delay start of Pharmacological VTE agent (>24hrs) due to surgical blood loss or risk of bleeding: not applicable

## 2021-02-24 NOTE — H&P (Signed)
REFERRING PHYSICIAN: Sela Hilding, MD  PROVIDER: Landry Corporal, MD  MRN: [id number] DOB: 05-Apr-1965 Subjective   Chief Complaint: Follow-up (/)   History of Present Illness: Frances Patton is a 56 y.o. female who is seen today as an office consultation at the request of Dr. Lindell Noe for evaluation of Follow-up (/) .   We are asked to see the patient in consultation by Dr. Sela Hilding to evaluate her for a new left breast cancer. The patient is a 56 year old female who recently went for a routine screening mammogram. At that time she was found to have a 1.4 cm area of distortion in the outer left breast. This was biopsied and came back as ductal carcinoma in situ that was ER and PR positive. She has family history of breast cancer in her mother. She is otherwise healthy and does not smoke.  Review of Systems: A complete review of systems was obtained from the patient. I have reviewed this information and discussed as appropriate with the patient. See HPI as well for other ROS.  ROS   Medical History: Past Medical History:  Diagnosis Date   GERD (gastroesophageal reflux disease)   Patient Active Problem List  Diagnosis   Ductal carcinoma in situ (DCIS) of left breast   History reviewed. No pertinent surgical history.   Allergies  Allergen Reactions   Latex Itching   Current Outpatient Medications on File Prior to Visit  Medication Sig Dispense Refill   ergocalciferol, vitamin D2, 10 mcg (400 unit) Tab Take by mouth   lecithin 518 mg Cap Take by mouth   omeprazole (PRILOSEC) 10 MG DR capsule Take 10 mg by mouth once daily   No current facility-administered medications on file prior to visit.   History reviewed. No pertinent family history.   Social History   Tobacco Use  Smoking Status Never  Smokeless Tobacco Never    Social History   Socioeconomic History   Marital status: Married  Tobacco Use   Smoking status: Never   Smokeless tobacco:  Never  Substance and Sexual Activity   Alcohol use: Never   Drug use: Never   Objective:   Vitals:  Pulse: 81  Temp: 36.7 C (98.1 F)  SpO2: 99%  Weight: 80.2 kg (176 lb 12.8 oz)  Height: 166.4 cm (5' 5.5")   Body mass index is 28.97 kg/m.  Physical Exam Vitals reviewed.  Constitutional:  General: She is not in acute distress. Appearance: Normal appearance.  HENT:  Head: Normocephalic and atraumatic.  Right Ear: External ear normal.  Left Ear: External ear normal.  Nose: Nose normal.  Mouth/Throat:  Mouth: Mucous membranes are moist.  Pharynx: Oropharynx is clear.  Eyes:  General: No scleral icterus. Extraocular Movements: Extraocular movements intact.  Conjunctiva/sclera: Conjunctivae normal.  Pupils: Pupils are equal, round, and reactive to light.  Cardiovascular:  Rate and Rhythm: Normal rate and regular rhythm.  Pulses: Normal pulses.  Heart sounds: Normal heart sounds.  Pulmonary:  Effort: Pulmonary effort is normal. No respiratory distress.  Breath sounds: Normal breath sounds.  Abdominal:  General: Bowel sounds are normal.  Palpations: Abdomen is soft.  Tenderness: There is no abdominal tenderness.  Musculoskeletal:  General: No swelling, tenderness or deformity. Normal range of motion.  Cervical back: Normal range of motion and neck supple.  Skin: General: Skin is warm and dry.  Coloration: Skin is not jaundiced.  Neurological:  General: No focal deficit present.  Mental Status: She is alert and oriented to person, place, and  time.  Psychiatric:  Mood and Affect: Mood normal.  Behavior: Behavior normal.     Breast: There is a moderate size palpable bruise in the lower outer left breast. Other than this there is no other palpable mass in either breast. There is no palpable axillary, supraclavicular, or cervical lymphadenopathy.  Labs, Imaging and Diagnostic Testing:  Assessment and Plan:   Diagnoses and all orders for this visit:  Ductal  carcinoma in situ (DCIS) of left breast - Ambulatory Referral to Oncology-Medical - Ambulatory Referral to Radiation Oncology - [facility] Case Posting Request; Future    The patient appears to have a 1.4 cm area of ductal carcinoma in situ in the outer left breast. I have discussed with her in detail the different options for treatment and at this point she favors breast conservation which I feel is very reasonable. She will not need a node evaluation. I will refer her to medical and radiation oncology to discuss adjuvant therapy. I have discussed with her in detail the risks and benefits of the operation as well as some of the technical aspects including the use of a radioactive seed for localization and she understands and wishes to proceed.

## 2021-02-24 NOTE — Anesthesia Procedure Notes (Signed)
Procedure Name: LMA Insertion Date/Time: 02/24/2021 1:27 PM Performed by: Lavonia Dana, CRNA Pre-anesthesia Checklist: Patient identified, Emergency Drugs available, Suction available and Patient being monitored Patient Re-evaluated:Patient Re-evaluated prior to induction Oxygen Delivery Method: Circle system utilized Preoxygenation: Pre-oxygenation with 100% oxygen Induction Type: IV induction Ventilation: Mask ventilation without difficulty LMA: LMA inserted LMA Size: 4.0 Number of attempts: 1 Airway Equipment and Method: Bite block Placement Confirmation: positive ETCO2 Tube secured with: Tape Dental Injury: Teeth and Oropharynx as per pre-operative assessment

## 2021-02-24 NOTE — Anesthesia Preprocedure Evaluation (Signed)
Anesthesia Evaluation  Patient identified by MRN, date of birth, ID band Patient awake    Reviewed: Allergy & Precautions, H&P , NPO status , Patient's Chart, lab work & pertinent test results  Airway Mallampati: II   Neck ROM: full    Dental   Pulmonary neg pulmonary ROS,    breath sounds clear to auscultation       Cardiovascular negative cardio ROS   Rhythm:regular Rate:Normal     Neuro/Psych  Headaches,    GI/Hepatic GERD  ,  Endo/Other    Renal/GU      Musculoskeletal   Abdominal   Peds  Hematology   Anesthesia Other Findings   Reproductive/Obstetrics Breast CA                             Anesthesia Physical Anesthesia Plan  ASA: 2  Anesthesia Plan: General   Post-op Pain Management:    Induction: Intravenous  PONV Risk Score and Plan: 3 and Ondansetron, Dexamethasone, Midazolam and Treatment may vary due to age or medical condition  Airway Management Planned: LMA  Additional Equipment:   Intra-op Plan:   Post-operative Plan: Extubation in OR  Informed Consent: I have reviewed the patients History and Physical, chart, labs and discussed the procedure including the risks, benefits and alternatives for the proposed anesthesia with the patient or authorized representative who has indicated his/her understanding and acceptance.     Dental advisory given  Plan Discussed with: CRNA, Anesthesiologist and Surgeon  Anesthesia Plan Comments:         Anesthesia Quick Evaluation

## 2021-02-24 NOTE — Interval H&P Note (Signed)
History and Physical Interval Note:  02/24/2021 12:54 PM  Echo  has presented today for surgery, with the diagnosis of LEFT BREAST DCIS.  The various methods of treatment have been discussed with the patient and family. After consideration of risks, benefits and other options for treatment, the patient has consented to  Procedure(s): LEFT BREAST LUMPECTOMY WITH RADIOACTIVE SEED LOCALIZATION (Left) as a surgical intervention.  The patient's history has been reviewed, patient examined, no change in status, stable for surgery.  I have reviewed the patient's chart and labs.  Questions were answered to the patient's satisfaction.     Autumn Messing III

## 2021-02-24 NOTE — Discharge Instructions (Signed)
NEXT dose of OTC Tylenol after 6:48pm as needed for pain. NEXT dose of OTC Ibuprofen/Advil/NSAIDs  after 8:48pm as needed for pain.   Post Anesthesia Home Care Instructions  Activity: Get plenty of rest for the remainder of the day. A responsible individual must stay with you for 24 hours following the procedure.  For the next 24 hours, DO NOT: -Drive a car -Paediatric nurse -Drink alcoholic beverages -Take any medication unless instructed by your physician -Make any legal decisions or sign important papers.  Meals: Start with liquid foods such as gelatin or soup. Progress to regular foods as tolerated. Avoid greasy, spicy, heavy foods. If nausea and/or vomiting occur, drink only clear liquids until the nausea and/or vomiting subsides. Call your physician if vomiting continues.  Special Instructions/Symptoms: Your throat may feel dry or sore from the anesthesia or the breathing tube placed in your throat during surgery. If this causes discomfort, gargle with warm salt water. The discomfort should disappear within 24 hours.  If you had a scopolamine patch placed behind your ear for the management of post- operative nausea and/or vomiting:  1. The medication in the patch is effective for 72 hours, after which it should be removed.  Wrap patch in a tissue and discard in the trash. Wash hands thoroughly with soap and water. 2. You may remove the patch earlier than 72 hours if you experience unpleasant side effects which may include dry mouth, dizziness or visual disturbances. 3. Avoid touching the patch. Wash your hands with soap and water after contact with the patch.

## 2021-02-24 NOTE — Transfer of Care (Signed)
Immediate Anesthesia Transfer of Care Note  Patient: Frances Patton  Procedure(s) Performed: LEFT BREAST LUMPECTOMY WITH RADIOACTIVE SEED LOCALIZATION (Left: Breast)  Patient Location: PACU  Anesthesia Type:General  Level of Consciousness: drowsy  Airway & Oxygen Therapy: Patient Spontanous Breathing and Patient connected to face mask oxygen  Post-op Assessment: Report given to RN and Post -op Vital signs reviewed and stable  Post vital signs: Reviewed and stable  Last Vitals:  Vitals Value Taken Time  BP 136/60 02/24/21 1413  Temp    Pulse 94 02/24/21 1415  Resp 17 02/24/21 1415  SpO2 99 % 02/24/21 1415  Vitals shown include unvalidated device data.  Last Pain:  Vitals:   02/24/21 1246  TempSrc: Oral  PainSc: 0-No pain      Patients Stated Pain Goal: 6 (40/99/27 8004)  Complications: No notable events documented.

## 2021-02-25 NOTE — Anesthesia Postprocedure Evaluation (Signed)
Anesthesia Post Note  Patient: Frances Patton  Procedure(s) Performed: LEFT BREAST LUMPECTOMY WITH RADIOACTIVE SEED LOCALIZATION (Left: Breast)     Patient location during evaluation: PACU Anesthesia Type: General Level of consciousness: awake and alert Pain management: pain level controlled Vital Signs Assessment: post-procedure vital signs reviewed and stable Respiratory status: spontaneous breathing, nonlabored ventilation, respiratory function stable and patient connected to nasal cannula oxygen Cardiovascular status: blood pressure returned to baseline and stable Postop Assessment: no apparent nausea or vomiting Anesthetic complications: no   No notable events documented.  Last Vitals:  Vitals:   02/24/21 1445 02/24/21 1500  BP: 140/76 (!) 146/74  Pulse: 84 69  Resp: 14 16  Temp:  36.5 C  SpO2: 97% 95%    Last Pain:  Vitals:   02/24/21 1500  TempSrc: Oral  PainSc: [place]

## 2021-02-27 ENCOUNTER — Inpatient Hospital Stay: Payer: 59

## 2021-02-27 ENCOUNTER — Other Ambulatory Visit: Payer: Self-pay

## 2021-02-27 ENCOUNTER — Encounter (HOSPITAL_BASED_OUTPATIENT_CLINIC_OR_DEPARTMENT_OTHER): Payer: Self-pay | Admitting: General Surgery

## 2021-02-27 ENCOUNTER — Telehealth: Payer: Self-pay | Admitting: Radiation Oncology

## 2021-02-27 ENCOUNTER — Inpatient Hospital Stay: Payer: 59 | Attending: Hematology and Oncology | Admitting: Hematology and Oncology

## 2021-02-27 ENCOUNTER — Encounter: Payer: Self-pay | Admitting: *Deleted

## 2021-02-27 DIAGNOSIS — D0512 Intraductal carcinoma in situ of left breast: Secondary | ICD-10-CM | POA: Insufficient documentation

## 2021-02-27 DIAGNOSIS — C50912 Malignant neoplasm of unspecified site of left female breast: Secondary | ICD-10-CM

## 2021-02-27 DIAGNOSIS — Z170 Estrogen receptor positive status [ER+]: Secondary | ICD-10-CM | POA: Diagnosis not present

## 2021-02-27 NOTE — Progress Notes (Signed)
[facility] NOTE  Patient Care Team: Glenis Smoker, MD as PCP - General (Family Medicine) Jovita Kussmaul, MD as Consulting Physician (General Surgery)  CHIEF COMPLAINTS/PURPOSE OF CONSULTATION:  DCIS  ASSESSMENT & PLAN:  Malignant neoplasm of left breast in female, estrogen receptor positive [facility])  Pathology review: I discussed with the patient the difference between DCIS and invasive breast cancer. It is considered a precancerous lesion. DCIS is classified as a Stage 0 breast cancer. It is generally detected through mammograms as calcifications. We discussed the significance of grades and its impact on prognosis. We also discussed the importance of ER and PR receptors and their implications to adjuvant treatment options. Prognosis of DCIS dependence on grade and degree of comedo necrosis. It is anticipated that if not treated, 20-30% of DCIS can develop into invasive breast cancer.  Recommendation: 1. Breast conserving surgery 2. Followed by adjuvant radiation therapy 3. Followed by antiestrogen therapy with tamoxifen/aromatase inhibitors based on menopausal status 5 years  Tamoxifen counseling: We discussed the risks and benefits of tamoxifen. These include but not limited to insomnia, hot flashes, mood changes, vaginal dryness, and weight gain. Although rare, serious side effects including endometrial cancer, risk of blood clots were also discussed. We strongly believe that the benefits far outweigh the risks. Patient understands these risks and consented to starting treatment. Planned treatment duration is 5 years.  Aromatase inhibitors counseling: We have discussed the mechanism of action of aromatase inhibitors today.  We have discussed adverse effects including but not limited to menopausal symptoms, increased risk of osteoporosis and fractures, cardiovascular events, arthralgias and myalgias.  We do believe that the benefits far outweigh the risks.  Plan  treatment duration of 5 years.  Patient would like to think about her options and return to clinic after completion of adjuvant radiation.  We have discussed that in case her final pathology shows evidence of invasive carcinoma, we will consider Oncotype testing to discuss role of any adjuvant chemotherapy.  She expressed understanding of these recommendations.  She should follow-up with Dr. Lisbeth Renshaw and return to clinic to see [place] in early April.  No orders of the defined types were placed in this encounter. HISTORY OF PRESENTING ILLNESS:  Frances Patton 56 y.o. 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 01/18/2021 diagnostic mammogram of the left breast showed suspicious area of distortion with calcifications in the lateral left breast, no evidence of left axillary lymphadenopathy. Biopsy from the left breast mass showed ductal carcinoma in situ, nuclear grade 2, cribriform/comedo/solid patterns with central necrosis and calcifications, longest involved segment 4 mm in greatest length, ER +100%, strong staining intensity, PR +30%, strong staining intensity She underwent left breast lumpectomy, final pathology pending. She is here with her husband, doing well, some post operative pain. No past medical history for which she takes medications on a daily basis.  NO other concerns. Rest of the pertinent 10 point ROS reviewed and negative.  REVIEW OF SYSTEMS:   Constitutional: Denies fevers, chills or abnormal night sweats Eyes: Denies blurriness of vision, double vision or watery eyes Ears, nose, mouth, throat, and face: Denies mucositis or sore throat Respiratory: Denies cough, dyspnea or wheezes Cardiovascular: Denies palpitation, chest discomfort or lower extremity swelling Gastrointestinal:  Denies nausea,  heartburn or change in bowel habits Skin: Denies abnormal skin rashes Lymphatics:  Denies new lymphadenopathy or easy bruising Neurological:Denies numbness, tingling or new weaknesses Behavioral/Psych: Mood is stable, no new changes  All other systems were reviewed with the patient and are negative.  MEDICAL HISTORY:  Past Medical History:  Diagnosis Date   Cancer ([place]) 02/03/2021   Left breast DCIS   GERD (gastroesophageal reflux disease)    Headache    PCOS (polycystic ovarian syndrome)     SURGICAL HISTORY: Past Surgical History:  Procedure Laterality Date   BREAST BIOPSY Left 01/26/2021   stereo biopsy/ x clip/ path pending   BREAST LUMPECTOMY WITH RADIOACTIVE SEED LOCALIZATION Left 02/24/2021   Procedure: LEFT BREAST LUMPECTOMY WITH RADIOACTIVE SEED LOCALIZATION;  Surgeon: Jovita Kussmaul, MD;  Location: [facility];  Service: General;  Laterality: Left;   COLONOSCOPY      SOCIAL HISTORY: Social History   Socioeconomic History   Marital status: Married    Spouse name: Not on file   Number of children: Not on file   Years of education: Not on file   Highest education level: Not on file  Occupational History   Not on file  Tobacco Use   Smoking status: Never   Smokeless tobacco: Never  Vaping Use   Vaping Use: Never used  Substance and Sexual Activity   Alcohol use: Never   Drug use: Never   Sexual activity: Not on file  Other Topics Concern   Not on file  Social History Narrative   Not on file   Social Determinants of Health   Financial Resource Strain: Not on file  Food Insecurity: Not on file  Transportation Needs: Not on file  Physical Activity: Not on file  Stress: Not on file  Social Connections: Not on file  Intimate Partner Violence: Not on file    FAMILY HISTORY: Family History  Problem Relation Age of Onset   Breast cancer Mother    Prostate cancer Father    Mom had it in 60/70 Dad died from metastatic prostate cancer at  67.  ALLERGIES:  is allergic to latex.  MEDICATIONS:  Current Outpatient Medications  Medication Sig Dispense Refill   b complex vitamins capsule Take by mouth daily.     ibuprofen (ADVIL) 200 MG tablet Take by mouth.     magnesium 30 MG tablet Take by mouth 2 (two) times daily.     omeprazole (PRILOSEC) 10 MG capsule Take by mouth.     oxyCODONE (ROXICODONE) 5 MG immediate release tablet Take 1 tablet (5 mg total) by mouth every 4 (four) hours as needed for severe pain. 15 tablet 0   pyridOXINE (VITAMIN B-6) 100 MG tablet Take 100 mg by mouth daily.     VITAMIN D PO Take by mouth.     No current facility-administered medications for this visit.     PHYSICAL EXAMINATION: ECOG PERFORMANCE STATUS: 0 - Asymptomatic  Vitals:   02/27/21 1010  BP: (!) 130/48  Pulse: 81  Resp: 18  Temp: (!) 97.5 F (36.4 C)  SpO2: 98%   Filed Weights   02/27/21 1010  Weight: 174 lb 4.8 oz (79.1 kg)    GENERAL:alert, no distress and comfortable Breast: Left breast healing well, right breast normal to inspection and palpation. PSYCH: alert & oriented x 3 with fluent speech NEURO: no focal motor/sensory deficits  LABORATORY DATA:  I have reviewed the data as listed No results found for: WBC, HGB, HCT, MCV, PLT   Chemistry   No results found for: NA, K, CL,  CO2, BUN, CREATININE, GLU No results found for: CALCIUM, ALKPHOS, AST, ALT, BILITOT     RADIOGRAPHIC STUDIES: I have personally reviewed the radiological images as listed and agreed with the findings in the report. MM Breast Surgical Specimen  Result Date: 02/24/2021 CLINICAL DATA:  Status post left breast surgery EXAM: SPECIMEN RADIOGRAPH OF THE LEFT BREAST COMPARISON:  Previous exam(s). FINDINGS: Status post excision of the left breast. The radioactive seed and biopsy marker clip are present, completely intact, and were marked for pathology. IMPRESSION: Specimen radiograph of the left breast. Electronically Signed   By: Abelardo Diesel  M.D.   On: 02/24/2021 13:55  MM LT RADIOACTIVE SEED LOC MAMMO GUIDE  Result Date: 02/23/2021 CLINICAL DATA:  Radioactive seed localization of the left breast prior to lumpectomy. EXAM: MAMMOGRAPHIC GUIDED RADIOACTIVE SEED LOCALIZATION OF THE LEFT BREAST COMPARISON:  Previous exam(s). FINDINGS: Patient presents for radioactive seed localization prior to left breast lumpectomy. I met with the patient and we discussed the procedure of seed localization including benefits and alternatives. We discussed the high likelihood of a successful procedure. We discussed the risks of the procedure including infection, bleeding, tissue injury and further surgery. We discussed the low dose of radioactivity involved in the procedure. Informed, written consent was given. The usual time-out protocol was performed immediately prior to the procedure. Using mammographic guidance, sterile technique, 1% lidocaine and an I-125 radioactive seed, the X shaped biopsy marking clip in the lower outer left breast was localized using a lateral approach. The follow-up mammogram images confirm the seed in the expected location and were marked for Dr. Marlou Starks. Follow-up survey of the patient confirms presence of the radioactive seed. Order number of I-125 seed:  [id number]. Total activity:  3.818 millicuries reference Date: 02/20/2021 The patient tolerated the procedure well and was released from the [facility]. She was given instructions regarding seed removal. IMPRESSION: Radioactive seed localization left breast. No apparent complications. Electronically Signed   By: Ammie Ferrier M.D.   On: 02/23/2021 14:22   All questions were answered. The patient knows to call the clinic with any problems, questions or concerns. I spent 60 minutes in the care of this patient including H and P, review of records, counseling and coordination of care.     Benay Pike, MD 02/27/2021 11:19 AM

## 2021-02-27 NOTE — Progress Notes (Deleted)
[facility] NOTE  Patient Care Team: Glenis Smoker, MD as PCP - General (Family Medicine) Jovita Kussmaul, MD as Consulting Physician (General Surgery)  CHIEF COMPLAINTS/PURPOSE OF CONSULTATION:  DCIS  ASSESSMENT & PLAN:  No problem-specific Assessment & Plan notes found for this encounter.  No orders of the defined types were placed in this encounter.  Pathology review: I discussed with the patient the difference between DCIS and invasive breast cancer. It is considered a precancerous lesion. DCIS is classified as a Stage 0 breast cancer. It is generally detected through mammograms as calcifications. We discussed the significance of grades and its impact on prognosis. We also discussed the importance of ER and PR receptors and their implications to adjuvant treatment options. Prognosis of DCIS dependence on grade and degree of comedo necrosis. It is anticipated that if not treated, 20-30% of DCIS can develop into invasive breast cancer.  Recommendation: 1. Breast conserving surgery 2. Followed by adjuvant radiation therapy 3. Followed by antiestrogen therapy with tamoxifen/aromatase inhibitors based on menopausal status 5 years  Tamoxifen counseling: We discussed the risks and benefits of tamoxifen. These include but not limited to insomnia, hot flashes, mood changes, vaginal dryness, and weight gain. Although rare, serious side effects including endometrial cancer, risk of blood clots were also discussed. We strongly believe that the benefits far outweigh the risks. Patient understands these risks and consented to starting treatment. Planned treatment duration is 5 years.  Aromatase inhibitors counseling: We have discussed the mechanism of action of aromatase inhibitors today.  We have discussed adverse effects including but not limited to menopausal symptoms, increased risk of osteoporosis and fractures, cardiovascular events, arthralgias and myalgias.  We do  believe that the benefits far outweigh the risks.  Plan treatment duration of 5 years.    HISTORY OF PRESENTING ILLNESS:  Frances Patton 56 y.o. 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 01/18/2021 diagnostic mammogram of the left breast showed suspicious area of distortion with calcifications in the lateral left breast, no evidence of left axillary lymphadenopathy. Biopsy from the left breast mass showed ductal carcinoma in situ, nuclear grade 2, cribriform/comedo/solid patterns with central necrosis and calcifications, longest involved segment 4 mm in greatest length, ER +100%, strong staining intensity, PR +30%, strong staining intensity She underwent left breast lumpectomy, final pathology pending  REVIEW OF SYSTEMS:   Constitutional: Denies fevers, chills or abnormal night sweats Eyes: Denies blurriness of vision, double vision or watery eyes Ears, nose, mouth, throat, and face: Denies mucositis or sore throat Respiratory: Denies cough, dyspnea or wheezes Cardiovascular: Denies palpitation, chest discomfort or lower extremity swelling Gastrointestinal:  Denies nausea, heartburn or change in bowel habits Skin: Denies abnormal skin rashes Lymphatics: Denies new lymphadenopathy or easy bruising Neurological:Denies numbness, tingling or new weaknesses Behavioral/Psych: Mood is stable, no new changes  All other systems were reviewed with the patient and are negative.  MEDICAL HISTORY:  Past Medical History:  Diagnosis Date   Cancer ([place]) 02/03/2021   Left breast DCIS   GERD (gastroesophageal reflux disease)    Headache    PCOS (polycystic ovarian syndrome)     SURGICAL HISTORY: Past Surgical History:  Procedure Laterality Date   BREAST BIOPSY Left 01/26/2021   stereo biopsy/ x clip/ path pending    COLONOSCOPY      SOCIAL HISTORY: Social  History   Socioeconomic History   Marital status: Married    Spouse name: Not on file   Number of children: Not on file   Years of education: Not on file   Highest education level: Not on file  Occupational History   Not on file  Tobacco Use   Smoking status: Never   Smokeless tobacco: Never  Vaping Use   Vaping Use: Never used  Substance and Sexual Activity   Alcohol use: Never   Drug use: Never   Sexual activity: Not on file  Other Topics Concern   Not on file  Social History Narrative   Not on file   Social Determinants of Health   Financial Resource Strain: Not on file  Food Insecurity: Not on file  Transportation Needs: Not on file  Physical Activity: Not on file  Stress: Not on file  Social Connections: Not on file  Intimate Partner Violence: Not on file    FAMILY HISTORY: Family History  Problem Relation Age of Onset   Breast cancer Mother    Prostate cancer Father     ALLERGIES:  is allergic to latex.  MEDICATIONS:  Current Outpatient Medications  Medication Sig Dispense Refill   b complex vitamins capsule Take by mouth daily.     ibuprofen (ADVIL) 200 MG tablet Take by mouth.     magnesium 30 MG tablet Take by mouth 2 (two) times daily.     omeprazole (PRILOSEC) 10 MG capsule Take by mouth.     oxyCODONE (ROXICODONE) 5 MG immediate release tablet Take 1 tablet (5 mg total) by mouth every 4 (four) hours as needed for severe pain. 15 tablet 0   pyridOXINE (VITAMIN B-6) 100 MG tablet Take 100 mg by mouth daily.     VITAMIN D PO Take by mouth.     No current facility-administered medications for this visit.     PHYSICAL EXAMINATION: ECOG PERFORMANCE STATUS: 0 - Asymptomatic  There were no vitals filed for this visit. There were no vitals filed for this visit.  GENERAL:alert, no distress and comfortable SKIN: skin color, texture, turgor are normal, no rashes or significant lesions EYES: normal,  conjunctiva are pink and non-injected, sclera clear OROPHARYNX:no exudate, no erythema and lips, buccal mucosa, and tongue normal  NECK: supple, thyroid normal size, non-tender, without nodularity LYMPH:  no palpable lymphadenopathy in the cervical, axillary or inguinal LUNGS: clear to auscultation and percussion with normal breathing effort HEART: regular rate & rhythm and no murmurs and no lower extremity edema ABDOMEN:abdomen soft, non-tender and normal bowel sounds Musculoskeletal:no cyanosis of digits and no clubbing  PSYCH: alert & oriented x 3 with fluent speech NEURO: no focal motor/sensory deficits  LABORATORY DATA:  I have reviewed the data as listed No results found for: WBC, HGB, HCT, MCV, PLT   Chemistry   No results found for: NA, K, CL, CO2, BUN, CREATININE, GLU No results found for: CALCIUM, ALKPHOS, AST, ALT, BILITOT     RADIOGRAPHIC STUDIES: I have personally reviewed the radiological images as listed and agreed with the findings in the report. MM Breast Surgical Specimen  Result Date: 02/24/2021 CLINICAL DATA:  Status post left breast surgery EXAM: SPECIMEN RADIOGRAPH OF THE LEFT BREAST COMPARISON:  Previous exam(s). FINDINGS: Status post excision of the left breast. The radioactive seed and biopsy marker clip are present, completely intact, and were marked for pathology. IMPRESSION: Specimen radiograph of the left breast. Electronically Signed   By: Abelardo Diesel M.D.   On:  02/24/2021 13:55  MM LT RADIOACTIVE SEED LOC MAMMO GUIDE  Result Date: 02/23/2021 CLINICAL DATA:  Radioactive seed localization of the left breast prior to lumpectomy. EXAM: MAMMOGRAPHIC GUIDED RADIOACTIVE SEED LOCALIZATION OF THE LEFT BREAST COMPARISON:  Previous exam(s). FINDINGS: Patient presents for radioactive seed localization prior to left breast lumpectomy. I met with the patient and we discussed the procedure of seed localization including benefits and alternatives. We discussed the high  likelihood of a successful procedure. We discussed the risks of the procedure including infection, bleeding, tissue injury and further surgery. We discussed the low dose of radioactivity involved in the procedure. Informed, written consent was given. The usual time-out protocol was performed immediately prior to the procedure. Using mammographic guidance, sterile technique, 1% lidocaine and an I-125 radioactive seed, the X shaped biopsy marking clip in the lower outer left breast was localized using a lateral approach. The follow-up mammogram images confirm the seed in the expected location and were marked for Dr. Marlou Starks. Follow-up survey of the patient confirms presence of the radioactive seed. Order number of I-125 seed:  [id number]. Total activity:  1.324 millicuries reference Date: 02/20/2021 The patient tolerated the procedure well and was released from the [facility]. She was given instructions regarding seed removal. IMPRESSION: Radioactive seed localization left breast. No apparent complications. Electronically Signed   By: Ammie Ferrier M.D.   On: 02/23/2021 14:22   All questions were answered. The patient knows to call the clinic with any problems, questions or concerns. I spent *** minutes in the care of this patient including H and P, review of records, counseling and coordination of care.     Benay Pike, MD 02/27/2021 8:29 AM

## 2021-02-27 NOTE — Telephone Encounter (Signed)
Called patient to schedule FUN with Shona Simpson, PA and CT SIM. No answer, LVM for return call.

## 2021-02-27 NOTE — Assessment & Plan Note (Signed)
°  Pathology review: I discussed with the patient the difference between DCIS and invasive breast cancer. It is considered a precancerous lesion. DCIS is classified as a Stage 0 breast cancer. It is generally detected through mammograms as calcifications. We discussed the significance of grades and its impact on prognosis. We also discussed the importance of ER and PR receptors and their implications to adjuvant treatment options. Prognosis of DCIS dependence on grade and degree of comedo necrosis. It is anticipated that if not treated, 20-30% of DCIS can develop into invasive breast cancer.  Recommendation: 1. Breast conserving surgery 2. Followed by adjuvant radiation therapy 3. Followed by antiestrogen therapy with tamoxifen/aromatase inhibitors based on menopausal status 5 years  Tamoxifen counseling: We discussed the risks and benefits of tamoxifen. These include but not limited to insomnia, hot flashes, mood changes, vaginal dryness, and weight gain. Although rare, serious side effects including endometrial cancer, risk of blood clots were also discussed. We strongly believe that the benefits far outweigh the risks. Patient understands these risks and consented to starting treatment. Planned treatment duration is 5 years.  Aromatase inhibitors counseling: We have discussed the mechanism of action of aromatase inhibitors today.  We have discussed adverse effects including but not limited to menopausal symptoms, increased risk of osteoporosis and fractures, cardiovascular events, arthralgias and myalgias.  We do believe that the benefits far outweigh the risks.  Plan treatment duration of 5 years.  Patient would like to think about her options and return to clinic after completion of adjuvant radiation.  We have discussed that in case her final pathology shows evidence of invasive carcinoma, we will consider Oncotype testing to discuss role of any adjuvant chemotherapy.  She expressed understanding of  these recommendations.  She should follow-up with Dr. Lisbeth Renshaw and return to clinic to see [place] in early April.

## 2021-02-28 ENCOUNTER — Telehealth: Payer: Self-pay | Admitting: Radiation Oncology

## 2021-02-28 LAB — SURGICAL PATHOLOGY

## 2021-02-28 NOTE — Telephone Encounter (Signed)
Second attempt: Called patient to schedule FUN with Shona Simpson, PA and CT SIM. No answer, LVM for return call.

## 2021-03-01 ENCOUNTER — Encounter: Payer: Self-pay | Admitting: *Deleted

## 2021-03-14 ENCOUNTER — Encounter: Payer: Self-pay | Admitting: Radiation Oncology

## 2021-03-14 NOTE — Progress Notes (Signed)
03/23/21 Appointment canceled.  ?

## 2021-03-20 ENCOUNTER — Telehealth: Payer: Self-pay | Admitting: *Deleted

## 2021-03-20 NOTE — Telephone Encounter (Signed)
Late entry for 02/25/2021. ?Patient called reporting return fax number for FMLA: [phone].   ?

## 2021-03-21 NOTE — Telephone Encounter (Signed)
FMLA and forms successfully faxed to [place] at [phone].  Original copy to alphabetical file folder behind appointment registration area one for patient pick up. ? ?  ?

## 2021-03-22 ENCOUNTER — Other Ambulatory Visit: Payer: Self-pay | Admitting: Gastroenterology

## 2021-03-22 DIAGNOSIS — R109 Unspecified abdominal pain: Secondary | ICD-10-CM

## 2021-03-22 NOTE — Telephone Encounter (Signed)
Returned call and connected with Frances Patton [phone] (home) regarding status of FMLA form.   ?"Thank you.  I will pick up my copy on next appointment 03/30/2021." ?Currently denies further questions or needs.  ?

## 2021-03-23 ENCOUNTER — Ambulatory Visit
Admission: RE | Admit: 2021-03-23 | Discharge: 2021-03-23 | Disposition: A | Discharge status: Home / Self Care | Payer: 59 | Source: Ambulatory Visit | Attending: Radiation Oncology | Admitting: Radiation Oncology

## 2021-03-23 ENCOUNTER — Ambulatory Visit: Admission: RE | Admit: 2021-03-23 | Payer: 59 | Source: Ambulatory Visit

## 2021-03-23 ENCOUNTER — Ambulatory Visit: Payer: 59 | Admitting: Radiation Oncology

## 2021-03-23 DIAGNOSIS — Z170 Estrogen receptor positive status [ER+]: Secondary | ICD-10-CM

## 2021-03-23 DIAGNOSIS — C50912 Malignant neoplasm of unspecified site of left female breast: Secondary | ICD-10-CM

## 2021-03-27 NOTE — Progress Notes (Incomplete)
Radiation Oncology         [phone] ________________________________  Name: [name]        MRN: [id number]  Date of Service: 03/30/2021 DOB: 07-03-1965  LD:[id number], Frances Pall, MD  Benay Pike, MD     REFERRING PHYSICIAN: Benay Pike, MD   DIAGNOSIS: The encounter diagnosis was Malignant neoplasm of left breast in female, estrogen receptor positive, unspecified site of breast ([place]).   HISTORY OF PRESENT ILLNESS: Frances Patton is a 56 y.o. female with a diagnosis of left breast cancer. She had screening mammogram in September 2022 that showed calcifications in the left breast; no abnormality was seen in the right breast.  She returned for diagnostic imaging on 01/18/2021.  The patient was found to have an area of amorphous calcifications in the lateral left breast measuring up to 1.4 cm.  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    Since her last visit, she has undergone left  lumpectomy on 02/24/21. Final pathology revealed intermediate to high grade DCIS with necrosis and calcifications measuring 2.4 cm and the closest margins were the medial, inferior, and junction of the lateral and posterior margins were all 1 mm. No invasive disease was noted.     PREVIOUS RADIATION THERAPY: No   PAST MEDICAL HISTORY:  Past Medical History:  Diagnosis Date   Cancer ([place]) 02/03/2021   Left breast DCIS   GERD (gastroesophageal reflux disease)    Headache    PCOS (polycystic ovarian syndrome)        PAST SURGICAL HISTORY: Past Surgical History:  Procedure Laterality Date   BREAST BIOPSY Left 01/26/2021   stereo biopsy/ x clip/ path pending   BREAST LUMPECTOMY WITH RADIOACTIVE SEED LOCALIZATION Left 02/24/2021   Procedure: LEFT BREAST LUMPECTOMY WITH RADIOACTIVE SEED LOCALIZATION;  Surgeon: Jovita Kussmaul, MD;   Location: [place];  Service: General;  Laterality: Left;   COLONOSCOPY       FAMILY HISTORY:  Family History  Problem Relation Age of Onset   Breast cancer Mother    Prostate cancer Father      SOCIAL HISTORY:  reports that she has never smoked. She has never used smokeless tobacco. She reports that she does not drink alcohol and does not use drugs.  The patient is married and lives in [place].  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 She's originally from the [place]    ALLERGIES: Latex   MEDICATIONS:  Current Outpatient Medications  Medication Sig Dispense Refill   b complex vitamins capsule Take by mouth daily.     ibuprofen (ADVIL) 200 MG tablet Take by mouth.     magnesium 30 MG tablet Take by mouth 2 (two) times daily.     omeprazole (PRILOSEC) 10 MG capsule Take by mouth.     oxyCODONE (ROXICODONE) 5 MG immediate release tablet Take 1 tablet (5 mg total) by mouth every 4 (four) hours as needed for severe pain. (Patient not taking: Reported on 03/14/2021) 15 tablet 0   pyridOXINE (VITAMIN B-6) 100 MG tablet Take 100 mg by mouth daily.     VITAMIN D PO Take by mouth.     No current facility-administered medications for this visit.     REVIEW OF SYSTEMS: On review of systems, the patient reports that she is doing okay but has had some  soreness in her breast since her biopsy.     PHYSICAL EXAM:  Wt Readings from Last 3 Encounters:  02/27/21 174 lb 4.8 oz (79.1 kg)  02/24/21 171 lb 4.8 oz (77.7 kg)  02/22/21 173 lb 12.8 oz (78.8 kg)   Temp Readings from Last 3 Encounters:  02/27/21 (!) 97.5 F (36.4 C) (Tympanic)  02/24/21 97.7 F (36.5 C) (Oral)  02/22/21 97.7 F (36.5 C)   BP Readings from Last 3 Encounters:  02/27/21 (!) 130/48  02/24/21 (!) 146/74  02/22/21 136/65   Pulse Readings from Last 3 Encounters:  02/27/21 81  02/24/21 69  02/22/21 76    In general this is a well appearing Asian  female in no acute distress. She's alert and oriented x4 and appropriate throughout the examination. Cardiopulmonary assessment is negative for acute distress and she exhibits normal effort. Bilateral breast exam is deferred.    ECOG = 0  0 - Asymptomatic (Fully active, able to carry on all predisease activities without restriction)  1 - Symptomatic but completely ambulatory (Restricted in physically strenuous activity but ambulatory and able to carry out work of a light or sedentary nature. For example, light housework, office work)  2 - Symptomatic, <50% in bed during the day (Ambulatory and capable of all self care but unable to carry out any work activities. Up and about more than 50% of waking hours)  3 - Symptomatic, >50% in bed, but not bedbound (Capable of only limited self-care, confined to bed or chair 50% or more of waking hours)  4 - Bedbound (Completely disabled. Cannot carry on any self-care. Totally confined to bed or chair)  5 - Death   Eustace Pen MM, Creech RH, Tormey DC, et al. [phone]). "Toxicity and response criteria of the [facility] Group". [place] Oncol. 5 (6): 649-55    LABORATORY DATA:  No results found for: WBC, HGB, HCT, MCV, PLT No results found for: NA, K, CL, CO2 No results found for: ALT, AST, GGT, ALKPHOS, BILITOT    RADIOGRAPHY: No results found.     IMPRESSION/PLAN: 1. 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 Dr. Lisbeth Renshaw has reveiwed her course and I discussed the final  pathology findings and reviews the nature of  noninvasive breast disease.  I reviewed Dr. Ida Rogue recommendations for for external radiotherapy to the breast  to reduce risks of local recurrence followed by antiestrogen therapy. We discussed the risks, benefits, short, and long term effects of radiotherapy, as well as the curative intent, and the patient is interested in proceeding. I reviewed the delivery and logistics of radiotherapy and that Dr. Lisbeth Renshaw  recommends 4 weeks of radiotherapy to the left breast with deep inspiration breath-hold technique. Written consent is obtained and placed in the chart, a copy was provided to the patient. She will simulate tomorrow.    In a visit lasting *** minutes, greater than 50% of the time was spent face to face reviewing her case, as well as in preparation of, discussing, and coordinating the patient's care.    Carola Rhine, [facility]    **Disclaimer: This note was dictated with voice recognition software. Similar sounding words can inadvertently be transcribed and this note may contain transcription errors which may not have been corrected upon publication of note.**

## 2021-03-29 ENCOUNTER — Telehealth: Payer: Self-pay

## 2021-03-29 NOTE — Telephone Encounter (Signed)
Spoke w/ patient, verified identity, and reminded patient of her 2:00pm-03/30/21 in-person appointment w/ Shona Simpson PA-C. I advised patient to arrive 58mn early for check-in. I left my extension [phone] case patient needs anything. Patient verbalized understanding. ? ?

## 2021-03-30 ENCOUNTER — Ambulatory Visit
Admission: RE | Admit: 2021-03-30 | Discharge: 2021-03-30 | Disposition: A | Discharge status: Home / Self Care | Payer: 59 | Source: Ambulatory Visit | Attending: Radiation Oncology | Admitting: Radiation Oncology

## 2021-03-30 ENCOUNTER — Encounter: Payer: Self-pay | Admitting: Radiation Oncology

## 2021-03-30 ENCOUNTER — Encounter (HOSPITAL_COMMUNITY): Payer: Self-pay

## 2021-03-30 ENCOUNTER — Encounter: Payer: Self-pay | Admitting: *Deleted

## 2021-03-30 VITALS — BP 139/55 | HR 79 | Temp 97.8°F | Resp 20 | Ht 65.0 in | Wt 174.8 lb

## 2021-03-30 DIAGNOSIS — C50912 Malignant neoplasm of unspecified site of left female breast: Secondary | ICD-10-CM

## 2021-03-30 DIAGNOSIS — Z791 Long term (current) use of non-steroidal anti-inflammatories (NSAID): Secondary | ICD-10-CM | POA: Insufficient documentation

## 2021-03-30 DIAGNOSIS — K219 Gastro-esophageal reflux disease without esophagitis: Secondary | ICD-10-CM | POA: Insufficient documentation

## 2021-03-30 DIAGNOSIS — Z170 Estrogen receptor positive status [ER+]: Secondary | ICD-10-CM | POA: Diagnosis present

## 2021-03-30 DIAGNOSIS — E282 Polycystic ovarian syndrome: Secondary | ICD-10-CM | POA: Diagnosis not present

## 2021-03-30 DIAGNOSIS — Z510 Encounter for antineoplastic radiation therapy: Secondary | ICD-10-CM | POA: Diagnosis present

## 2021-03-30 DIAGNOSIS — C7981 Secondary malignant neoplasm of breast: Secondary | ICD-10-CM

## 2021-03-30 NOTE — Progress Notes (Signed)
Patient here for a "FUN" appointment w/ Shona Simpson PA-C. I verified patient identity and began nursing interview w/ spouse Mr. Judithe Keetch in attendance. Patient reports throbbing pain 7/10 in LT breast in the PM. No other symptoms reported at this time. ? ?Meaningful use complete. ?Postmenopausal- NO chances of pregnancy. ? ?BP (!) 139/55 (BP Location: Right Arm, Patient Position: Sitting, Cuff Size: Normal)   Pulse 79   Temp 97.8 ?F (36.6 ?C) (Temporal)   Resp 20   Ht '5\' 5"'$  (1.651 m)   Wt 174 lb 12.8 oz (79.3 kg)   SpO2 99%   BMI 29.09 kg/m?  ?

## 2021-03-31 ENCOUNTER — Ambulatory Visit: Payer: 59 | Admitting: Radiation Oncology

## 2021-04-04 DIAGNOSIS — Z510 Encounter for antineoplastic radiation therapy: Secondary | ICD-10-CM | POA: Diagnosis not present

## 2021-04-06 ENCOUNTER — Other Ambulatory Visit: Payer: Self-pay

## 2021-04-06 ENCOUNTER — Ambulatory Visit: Payer: 59 | Attending: Radiation Oncology

## 2021-04-06 DIAGNOSIS — I890 Lymphedema, not elsewhere classified: Secondary | ICD-10-CM | POA: Insufficient documentation

## 2021-04-06 DIAGNOSIS — Z483 Aftercare following surgery for neoplasm: Secondary | ICD-10-CM | POA: Diagnosis present

## 2021-04-06 DIAGNOSIS — C50912 Malignant neoplasm of unspecified site of left female breast: Secondary | ICD-10-CM | POA: Diagnosis not present

## 2021-04-06 DIAGNOSIS — M25612 Stiffness of left shoulder, not elsewhere classified: Secondary | ICD-10-CM | POA: Insufficient documentation

## 2021-04-06 DIAGNOSIS — Z170 Estrogen receptor positive status [ER+]: Secondary | ICD-10-CM | POA: Insufficient documentation

## 2021-04-06 NOTE — Patient Instructions (Signed)
?  Access Code: [email or web address] ?URL: [email or web address] ?Date: 04/06/2021 ?Prepared by: Cheral Almas ? ?Exercises ?- Seated Upper Trapezius Stretch  - 1 x daily - 7 x weekly - 1 sets - 5 reps - 5 hold ?- Neck Rotation Stretch  - 1 x daily - 7 x weekly - 1 sets - 5 reps - 5 hold ? ?

## 2021-04-06 NOTE — Therapy (Signed)
?OUTPATIENT PHYSICAL THERAPY ONCOLOGY EVALUATION ? ?Patient Name: Frances Patton ?MRN: [id number] ?DOB:Jan 25, 1965, 56 y.o., female ?Today's Date: 04/06/2021 ? ? ? ?Past Medical History:  ?Diagnosis Date  ? Cancer ([place]) 02/03/2021  ? Left breast DCIS  ? GERD (gastroesophageal reflux disease)   ? Headache   ? PCOS (polycystic ovarian syndrome)   ? ?Past Surgical History:  ?Procedure Laterality Date  ? BREAST BIOPSY Left 01/26/2021  ? stereo biopsy/ x clip/ path pending  ? BREAST LUMPECTOMY WITH RADIOACTIVE SEED LOCALIZATION Left 02/24/2021  ? Procedure: LEFT BREAST LUMPECTOMY WITH RADIOACTIVE SEED LOCALIZATION;  Surgeon: Jovita Kussmaul, MD;  Location: [facility];  Service: General;  Laterality: Left;  ? COLONOSCOPY    ? ?Patient Active Problem List  ? Diagnosis Date Noted  ? Malignant neoplasm of left breast in female, estrogen receptor positive ([place]) 02/01/2021  ? ? ?PCP: Glenis Smoker, MD ? ?REFERRING PROVIDER: Hayden Pedro,* ? ?REFERRING DIAG: Left breast Lymphedema, Decreased ROM ? ?THERAPY DIAG:  ?Malignant neoplasm of left breast in female, estrogen receptor positive, unspecified site of breast ([place]) ? ?Aftercare following surgery for neoplasm ? ?Stiffness of left shoulder, not elsewhere classified ? ?Lymphedema, not elsewhere classified ? ?ONSET DATE: 02/24/2021 ? ?SUBJECTIVE                                                                                                                                                                                          ? ?SUBJECTIVE STATEMENT: ?Pt is complaining of left breast pain/cramping and fullness s/p left breast lumpectomy for DCIS on 02/24/2021. She had her radiation simulation on 03/30/2021 and will start this week. She will also be on Tamoxifen. She also has complaints of difficulty raising her arm and had trouble with the radiation simulation last week. She is tender in her chest and has tenderness in her chest. Sometimes has  numbness in left arm and leg but it goes away quickly ?PERTINENT HISTORY:  ?Pt is s/p Left breast lumpectomy for DCIS. It was ER+, PR+.  No LN's were removed.She is pending radiation ?PAIN:  ?Are you having pain? Yes ?NPRS scale: 6-7/10 ?Pain location: chest/left arm ?Pain orientation: Left and Proximal  ?PAIN TYPE: sharp, tight, and cramping ?Pain description: intermittent, sharp, and aching  ?Aggravating factors: getting dressed, reaching, ?Relieving factors: resting with arm at sign,advil ? ?PRECAUTIONS: Other: post surgical ? ?WEIGHT BEARING RESTRICTIONS No ? ?FALLS:  ?Has patient fallen in last 6 months? No, Number of falls: 0 ? ?LIVING ENVIRONMENT: ?Lives with: lives with their family and lives with their spouse ?Lives in: House/apartment ? ? ?OCCUPATION: dialysis technician ? ?LEISURE: walking outside  and treadmill ? ?HAND DOMINANCE : right  ? ?PRIOR LEVEL OF FUNCTION: Independent ? ?PATIENT GOALS Decrease breast pain and fullness ? ? ?OBJECTIVE ? ?COGNITION: ? Overall cognitive status: Within functional limits for tasks assessed  ? ?PALPATION: Fibrosis/scar tissue  noted very tender in UT, pectoralsunder incision,  ? ?OBSERVATIONS / OTHER ASSESSMENTS: generalized breast swelling with healed incision ? ?SENSATION: ? Light touch: Appears intact ?  ? ?POSTURE: forward head, round shoulders ? ?UPPER EXTREMITY AROM/PROM: ? ?A/PROM RIGHT  04/06/2021 ?  ?Shoulder extension 57  ?Shoulder flexion 156  ?Shoulder abduction 165  ?Shoulder internal rotation 65 (FIR bra line)  ?Shoulder external rotation 105  ?  (Blank rows = not tested) ? ?A/PROM LEFT  04/06/2021  ?Shoulder extension 34  ?Shoulder flexion 124  ?Shoulder abduction 83  ?Shoulder internal rotation FIR left gluts  ?Shoulder external rotation   ?  (Blank rows = not tested) ? ? ?CERVICAL AROM: ?All within functional limits: Very tight in left UT with SB and rotation ? ?UPPER EXTREMITY STRENGTH: NT due to pain ? ? ?LYMPHEDEMA ASSESSMENTS:  ? ?SURGERY TYPE/DATE:  Left lumpectomy for DCIS 02/24/2021 ? ?NUMBER OF LYMPH NODES REMOVED: 0 ? ?CHEMOTHERAPY: no ? ?RADIATION:yes ? ?HORMONE TREATMENT: yes ? ?INFECTIONS: no ? ? ? ? ?QUICK DASH SURVEY: 36% ? ? ?TODAY'S TREATMENT  ?Educated pt about compression bras/sports bras that zip up front and gave her handout for prairie bra/discussed target, Laurena Spies etc ?Educated pt in 4 post op exercises to perform 2 times per day and left UT and rotation stretches all x5 reps 2 x per day. ? ?PATIENT EDUCATION:  ?Education details: Access Code: [email or web address] ?URL: [email or web address] ?Date: 04/06/2021 ?Prepared by: Cheral Almas ? ?Exercises ?- Seated Upper Trapezius Stretch  - 1 x daily - 7 x weekly - 1 sets - 5 reps - 5 hold ?- Neck Rotation Stretch  - 1 x daily - 7 x weekly - 1 sets - 5 reps - 5 hold ?4 post op exercises:supine flexion and stargazer, sitting scapular retraction, standing wall slide for abduction ?Person educated: Patient ?Education method: Explanation, Demonstration, and Handouts ?Education comprehension: verbalized understanding ? ? ?HOME EXERCISE PROGRAM: ?4 post op exercises:supine flex and stargazer, Scapular retraction, wall walk abduction, Right SB and rotation neck stretches. ? ?ASSESSMENT: ? ?CLINICAL IMPRESSION: ?Patient is a 56 y.o. female who was seen today for physical therapy evaluation and treatment for left breast pain and swelling with significant limitations in left shoulder  ROM and tightness in left cervical region. She is s/p left lumpectomy for DCIS on 02/24/2021 and has avoided using her arm. Her incision is well healed, but there is generalized fullness and scar tissue/fibrosis under incision. She was educated in a HEP for ROM, and educated in use of compression bra/sports bra and given handout.She will benefit from skilled PT to address deficits and return to PLOF  ? ? ?OBJECTIVE IMPAIRMENTS increased edema, postural dysfunction, and pain, ROM limitations  ? ?ACTIVITY LIMITATIONS  cleaning, and  anything requiring reaching, work activities .  ? ?PERSONAL FACTORS  mild language barrier  are also affecting patient's functional outcome.  ? ? ?REHAB POTENTIAL: Good ? ?CLINICAL DECISION MAKING: Stable/uncomplicated ? ?EVALUATION COMPLEXITY: Low ? ?GOALS: ?Goals reviewed with patient? Yes ? ?LONG TERM GOALS: ? ?Pt will be independent in self breast MLD to decrease swelling ?Baseline:  ?Target date: 05/18/2021 ?Goal status: INITIAL ? ?2.  Pt will have decreased pain and swelling by 50% or more ?Baseline:  ?Target date: 05/18/2021 ?Goal status:  INITIAL ? ?3.  Pt will have left shoulder flexion and scaption 125 for improved reaching ?Baseline:  ?Target date: 05/18/2021 ?Goal status: INITIAL ? ?4.  Pt will demonstrate full left shoulder ROM when compared with the right ?Baseline:  ?Target date: 05/18/2021 ?Goal status: INITIAL ? ?5.  Quick dash will be no greater than 15% ?Baseline: 36 ?Target date: 05/18/2021 ?Goal status: INITIAL ? ?6.  Pt will have appropriate sports/compression bra to decrease left breast edema ?Baseline: none ?Target date: 05/18/2021 ?Goal status: INITIAL ? ?PLAN: ?PT FREQUENCY: 1-2x/week ? ?PT DURATION: 6 weeks ? ?PLANNED INTERVENTIONS: Therapeutic exercises, Patient/Family education, Manual lymph drainage, Vasopneumatic device, and Manual therapy ? ?PLAN FOR NEXT SESSION: Review post op exs, progress to wand when ready, LTR, STM to pecs/UT/Lats etc (check about radiation), PROM, check bra, instruct pt in left breast MLD ? ? ?Claris Pong, PT ?04/06/2021, 10:04 AM ? ?

## 2021-04-10 ENCOUNTER — Encounter: Payer: Self-pay | Admitting: *Deleted

## 2021-04-10 ENCOUNTER — Ambulatory Visit: Payer: 59 | Admitting: Radiation Oncology

## 2021-04-11 ENCOUNTER — Other Ambulatory Visit: Payer: Self-pay

## 2021-04-11 ENCOUNTER — Ambulatory Visit
Admission: RE | Admit: 2021-04-11 | Discharge: 2021-04-11 | Disposition: A | Discharge status: Home / Self Care | Payer: 59 | Source: Ambulatory Visit | Attending: Radiation Oncology | Admitting: Radiation Oncology

## 2021-04-11 ENCOUNTER — Ambulatory Visit: Payer: 59

## 2021-04-11 DIAGNOSIS — Z510 Encounter for antineoplastic radiation therapy: Secondary | ICD-10-CM | POA: Diagnosis not present

## 2021-04-11 DIAGNOSIS — I890 Lymphedema, not elsewhere classified: Secondary | ICD-10-CM

## 2021-04-11 DIAGNOSIS — C50912 Malignant neoplasm of unspecified site of left female breast: Secondary | ICD-10-CM

## 2021-04-11 DIAGNOSIS — Z483 Aftercare following surgery for neoplasm: Secondary | ICD-10-CM

## 2021-04-11 DIAGNOSIS — M25612 Stiffness of left shoulder, not elsewhere classified: Secondary | ICD-10-CM

## 2021-04-11 MED ORDER — RADIAPLEXRX EX GEL: Freq: Once | CUTANEOUS | Status: AC

## 2021-04-11 NOTE — Therapy (Signed)
?OUTPATIENT PHYSICAL THERAPY TREATMENT NOTE ? ? ?Patient Name: Frances Patton ?MRN: [id number] ?DOB:12-Jun-1965, 56 y.o., female ?Today's Date: 04/11/2021 ? ?PCP: Glenis Smoker, MD ?REFERRING PROVIDER: Glenis Smoker, * ? ? PT End of Session - 04/11/21 1501   ? ? Visit Number 2   ? Number of Visits 12   ? Date for PT Re-Evaluation 05/18/21   ? PT Start Time 1503   ? PT Stop Time 1551   ? PT Time Calculation (min) 48 min   ? Activity Tolerance Patient tolerated treatment well   ? Behavior During Therapy [facility] for tasks assessed/performed   ? ?  ?  ? ?  ? ? ?Past Medical History:  ?Diagnosis Date  ? Cancer ([place]) 02/03/2021  ? Left breast DCIS  ? GERD (gastroesophageal reflux disease)   ? Headache   ? PCOS (polycystic ovarian syndrome)   ? ?Past Surgical History:  ?Procedure Laterality Date  ? BREAST BIOPSY Left 01/26/2021  ? stereo biopsy/ x clip/ path pending  ? BREAST LUMPECTOMY WITH RADIOACTIVE SEED LOCALIZATION Left 02/24/2021  ? Procedure: LEFT BREAST LUMPECTOMY WITH RADIOACTIVE SEED LOCALIZATION;  Surgeon: Jovita Kussmaul, MD;  Location: [place];  Service: General;  Laterality: Left;  ? COLONOSCOPY    ? ?Patient Active Problem List  ? Diagnosis Date Noted  ? Malignant neoplasm of left breast in female, estrogen receptor positive ([place]) 02/01/2021  ? ? ?REFERRING DIAG: left breast lymphedema, Decreased left shoulder ROM ? ?THERAPY DIAG:  ?Malignant neoplasm of left breast in female, estrogen receptor positive, unspecified site of breast ([place]) ? ?Aftercare following surgery for neoplasm ? ?Stiffness of left shoulder, not elsewhere classified ? ?Lymphedema, not elsewhere classified ? ?PERTINENT HISTORY: Pt is s/p Left breast lumpectomy for DCIS. It was ER+, PR+.  No LN's were removed.She is pending radiation ?PRECAUTIONS: post surgical, No LN's removed ? ?SUBJECTIVE: Just came from radiation. I have an appt to be measured for a bra but I forget when. I have been doing the exercises at  home. I have a bad HA today and I feel sleepy. ? ?PAIN:  ?Are you having pain? Yes: NPRS scale: 5/10 ?Pain location: left breast  ?Pain description: throbbing, pins and needles ?Aggravating factors: worse at night ?Relieving factors: sleeping ? ?UPPER EXTREMITY AROM/PROM: ?  ?A/PROM RIGHT  04/06/2021 ?   ?Shoulder extension 57  ?Shoulder flexion 156  ?Shoulder abduction 165  ?Shoulder internal rotation 65 (FIR bra line)  ?Shoulder external rotation 105  ?                        (Blank rows = not tested) ?  ?A/PROM LEFT  04/06/2021  ?Shoulder extension 34  ?Shoulder flexion 124  ?Shoulder abduction 83  ?Shoulder internal rotation FIR left gluts  ?Shoulder external rotation    ?                        (Blank rows = not tested) ?  ?  ?CERVICAL AROM: ?All within functional limits: Very tight in left UT with SB and rotation ?  ?UPPER EXTREMITY STRENGTH: NT due to pain ?  ?  ?LYMPHEDEMA ASSESSMENTS:  ?  ?SURGERY TYPE/DATE: Left lumpectomy for DCIS 02/24/2021 ?  ?NUMBER OF LYMPH NODES REMOVED: 0 ?  ?CHEMOTHERAPY: no ?  ?RADIATION:yes ?  ?HORMONE TREATMENT: yes ?  ?INFECTIONS: no ?  ?  ?  ?  ?QUICK DASH SURVEY: 36% ?  ?  ?  TODAY'S TREATMENT  ?04/11/2021 ?MT; STM to left pectoralis, lats, UT in supine ?PROM left shoulder flexion, scaption, abduction, ER ?Supine wand flexion and scaption x 3, trunk rotation with legs to left x 4 ?ZOX:WRUE pt. permission short neck, bilateral axillary LN's, left inguinal LN's,left breast redirecting to pathways and verbally instructing in role of lymphatics at same time ? ?04/06/2021 ?Educated pt about compression bras/sports bras that zip up front and gave her handout for prairie bra/discussed target, Laurena Spies etc ?Educated pt in 4 post op exercises to ,  ?URL: [email or web address] ?Date: 04/06/2021 ?Prepared by: Cheral Almas ?  ?Exercises ?- Seated Upper Trapezius Stretch  - 1 x daily - 7 x weekly - 1 sets - 5 reps - 5 hold ?- Neck Rotation Stretch  - 1 x daily - 7 x weekly - 1 sets -  5 reps - 5 hold ?4 post op exercises:supine flexion and stargazer, sitting scapular retraction, standing wall slide for abduction ?Person educated: Patient ?Education method: Explanation, Demonstration, and Handouts ?Education comprehension: verbalized understanding ?  ?  ?HOME EXERCISE PROGRAM: ?4 post op exercises:supine flex and stargazer, Scapular retraction, wall walk abduction, Right SB and rotation neck stretches., supine wand flexion and scaption ?  ?ASSESSMENT: ?  ?CLINICAL IMPRESSION: ?Pt performed AAROM, and therapist performed soft tissue mobilization, PROM to left shoulder and initiated left breast MLD with pt permission. Pt demonstrates good improvement in shoulder ROM, but continues with soreness and limitation.  She has not yet been able to get her compression bra. ?  ?  ?OBJECTIVE IMPAIRMENTS increased edema, postural dysfunction, and pain, ROM limitations  ?  ?ACTIVITY LIMITATIONS  cleaning, and anything requiring reaching, work activities .  ?  ?PERSONAL FACTORS  mild language barrier( [place]  are also affecting patient's functional outcome.  ?  ?  ?REHAB POTENTIAL: Good ?  ?CLINICAL DECISION MAKING: Stable/uncomplicated ?  ?EVALUATION COMPLEXITY: Low ?  ?GOALS: ?Goals reviewed with patient? Yes ?  ?LONG TERM GOALS: ?  ?Pt will be independent in self breast MLD to decrease swelling ?Baseline:  ?Target date: 05/18/2021 ?Goal status: INITIAL ?  ?2.  Pt will have decreased pain and swelling by 50% or more ?Baseline:  ?Target date: 05/18/2021 ?Goal status: INITIAL ?  ?3.  Pt will have left shoulder flexion and scaption 125 for improved reaching ?Baseline:  ?Target date: 05/18/2021 ?Goal status: INITIAL ?  ?4.  Pt will demonstrate full left shoulder ROM when compared with the right ?Baseline:  ?Target date: 05/18/2021 ?Goal status: INITIAL ?  ?5.  Quick dash will be no greater than 15% ?Baseline: 36 ?Target date: 05/18/2021 ?Goal status: INITIAL ?  ?6.  Pt will have appropriate sports/compression  bra to decrease left breast edema ?Baseline: none ?Target date: 05/18/2021 ?Goal status: INITIAL ?  ?PLAN: ?PT FREQUENCY: 1-2x/week ?  ?PT DURATION: 6 weeks ?  ?PLANNED INTERVENTIONS: Therapeutic exercises, Patient/Family education, Manual lymph drainage, Vasopneumatic device, and Manual therapy ?  ?PLAN FOR NEXT SESSION: Review post op exs, progress to wand when ready, LTR, STM to pecs/UT/Lats etc (check about radiation), PROM, check bra, instruct pt in left breast MLD ?  ?  ? ? ? ? ?Claris Pong, PT ?04/11/2021, 4:01 PM ? ?  ? ?

## 2021-04-11 NOTE — Patient Instructions (Signed)
SHOULDER: Flexion - Supine Kasandra Knudsen)        [facility] [phone] ? ? ? ?Hold cane in both hands. Raise arms up overhead. Do not allow back to arch. Hold _5__ seconds. Do __5-10__ times; __1-2__ times a day. ?Hands shoulder width ? Hands slightly wider than shoulder width ?

## 2021-04-11 NOTE — Progress Notes (Signed)
Pt here for patient teaching.   ° °Pt given Radiation and You booklet, skin care instructions, and Radiaplex gel.   ° °Reviewed areas of pertinence such as fatigue, hair loss, skin changes, breast tenderness, and breast swelling .  ° °Pt able to give teach back of to pat skin and use unscented/gentle soap,apply Radiaplex bid, avoid applying anything to skin within 4 hours of treatment, avoid wearing an under wire bra, and to use an electric razor if they must shave.  ° °Pt verbalizes understanding of information given and will contact nursing with any questions or concerns.   ° ° ° ° ° °  ° ° ° °  ° °  ° ° ° °  °

## 2021-04-12 ENCOUNTER — Ambulatory Visit: Payer: 59

## 2021-04-12 ENCOUNTER — Ambulatory Visit
Admission: RE | Admit: 2021-04-12 | Discharge: 2021-04-12 | Disposition: A | Discharge status: Home / Self Care | Payer: 59 | Source: Ambulatory Visit | Attending: Radiation Oncology | Admitting: Radiation Oncology

## 2021-04-12 DIAGNOSIS — Z510 Encounter for antineoplastic radiation therapy: Secondary | ICD-10-CM | POA: Diagnosis not present

## 2021-04-13 ENCOUNTER — Encounter: Payer: Self-pay | Admitting: Rehabilitation

## 2021-04-13 ENCOUNTER — Ambulatory Visit
Admission: RE | Admit: 2021-04-13 | Discharge: 2021-04-13 | Disposition: A | Discharge status: Home / Self Care | Payer: 59 | Source: Ambulatory Visit | Attending: Radiation Oncology | Admitting: Radiation Oncology

## 2021-04-13 ENCOUNTER — Ambulatory Visit: Payer: 59 | Admitting: Rehabilitation

## 2021-04-13 ENCOUNTER — Ambulatory Visit: Payer: 59

## 2021-04-13 ENCOUNTER — Other Ambulatory Visit: Payer: Self-pay

## 2021-04-13 DIAGNOSIS — C50912 Malignant neoplasm of unspecified site of left female breast: Secondary | ICD-10-CM | POA: Diagnosis not present

## 2021-04-13 DIAGNOSIS — Z510 Encounter for antineoplastic radiation therapy: Secondary | ICD-10-CM | POA: Diagnosis not present

## 2021-04-13 DIAGNOSIS — I890 Lymphedema, not elsewhere classified: Secondary | ICD-10-CM

## 2021-04-13 DIAGNOSIS — Z483 Aftercare following surgery for neoplasm: Secondary | ICD-10-CM

## 2021-04-13 DIAGNOSIS — M25612 Stiffness of left shoulder, not elsewhere classified: Secondary | ICD-10-CM

## 2021-04-13 NOTE — Therapy (Signed)
?OUTPATIENT PHYSICAL THERAPY TREATMENT NOTE ? ? ?Patient Name: Frances Patton ?MRN: [id number] ?DOB:1965/05/28, 56 y.o., female ?Today's Date: 04/13/2021 ? ?PCP: Glenis Smoker, MD ?REFERRING PROVIDER: Glenis Smoker, * ? ? PT End of Session - 04/13/21 0801   ? ? Visit Number 3   ? Number of Visits 12   ? Date for PT Re-Evaluation 05/18/21   ? PT Start Time [phone]   ? PT Stop Time 0850   ? PT Time Calculation (min) 47 min   ? Activity Tolerance Patient tolerated treatment well   ? Behavior During Therapy [facility] for tasks assessed/performed   ? ?  ?  ? ?  ? ? ? ?Past Medical History:  ?Diagnosis Date  ? Cancer ([place]) 02/03/2021  ? Left breast DCIS  ? GERD (gastroesophageal reflux disease)   ? Headache   ? PCOS (polycystic ovarian syndrome)   ? ?Past Surgical History:  ?Procedure Laterality Date  ? BREAST BIOPSY Left 01/26/2021  ? stereo biopsy/ x clip/ path pending  ? BREAST LUMPECTOMY WITH RADIOACTIVE SEED LOCALIZATION Left 02/24/2021  ? Procedure: LEFT BREAST LUMPECTOMY WITH RADIOACTIVE SEED LOCALIZATION;  Surgeon: Jovita Kussmaul, MD;  Location: [place];  Service: General;  Laterality: Left;  ? COLONOSCOPY    ? ?Patient Active Problem List  ? Diagnosis Date Noted  ? Malignant neoplasm of left breast in female, estrogen receptor positive ([place]) 02/01/2021  ? ? ?REFERRING DIAG: left breast lymphedema, Decreased left shoulder ROM ? ?THERAPY DIAG:  ?Malignant neoplasm of left breast in female, estrogen receptor positive, unspecified site of breast ([place]) ? ?Aftercare following surgery for neoplasm ? ?Stiffness of left shoulder, not elsewhere classified ? ?Lymphedema, not elsewhere classified ? ?PERTINENT HISTORY: Pt is s/p Left breast lumpectomy for DCIS. It was ER+, PR+.  No LN's were removed.She is pending radiation ?PRECAUTIONS: post surgical, No LN's removed ? ?SUBJECTIVE: It is sore.  It is getting better.  I have radiation after this today.  I have a bra appt on Friday ? ?PAIN:  ?Are you  having pain? No.  Only when I stretch my hand or reach ? ?UPPER EXTREMITY AROM/PROM: ?  ?A/PROM RIGHT  04/06/2021 ?   ?Shoulder extension 57  ?Shoulder flexion 156  ?Shoulder abduction 165  ?Shoulder internal rotation 65 (FIR bra line)  ?Shoulder external rotation 105  ?                        (Blank rows = not tested) ?  ?A/PROM LEFT  04/06/2021 04/13/21  ?Shoulder extension 34   ?Shoulder flexion 124 120  ?Shoulder abduction 83 122  ?Shoulder internal rotation FIR left gluts   ?Shoulder external rotation     ?                        (Blank rows = not tested) ?  ?  ?CERVICAL AROM: ?All within functional limits: Very tight in left UT with SB and rotation ?  ?UPPER EXTREMITY STRENGTH: NT due to pain ?  ?  ?LYMPHEDEMA ASSESSMENTS:  ?  ?SURGERY TYPE/DATE: Left lumpectomy for DCIS 02/24/2021 ?  ?NUMBER OF LYMPH NODES REMOVED: 0 ?  ?CHEMOTHERAPY: no ?  ?RADIATION:yes ?  ?HORMONE TREATMENT: yes ?  ?INFECTIONS: no ?  ?  ?  ?  ?QUICK DASH SURVEY: 36% ?  ?  ?TODAY'S TREATMENT  ?04/13/21 ?Remeasured AROM ?Pulleys flexion and abduction x 27mn each with cueing and  instruction ?UT stretch Lt 3x20", levator stretch 3x20" left  ?PROM left shoulder flexion, scaption, abduction, ER with vcs for relaxation throughout and education on how at this point she will not damage anything with movement and stretching and encouraged ADLs.   ?Supine wand flexion and scaption x 10, trunk rotation with legs to left x 4 with goal post arms 6" x 5  ?Sidelying:  abduction AROM and scapular pro/ret and elevation/depression x 5 each with manual cueing ?Held MT due to radiation post therapy ? ?04/11/2021 ?MT; STM to left pectoralis, lats, UT in supine ?PROM left shoulder flexion, scaption, abduction, ER ?Supine wand flexion and scaption x 3, trunk rotation with legs to left x 4 ?UXL:KGMW pt. permission short neck, bilateral axillary LN's, left inguinal LN's,left breast redirecting to pathways and verbally instructing in role of lymphatics at same  time ? ?04/06/2021 ?Educated pt about compression bras/sports bras that zip up front and gave her handout for prairie bra/discussed target, Laurena Spies etc ?Educated pt in 4 post op exercises to ,  ?URL: [email or web address] ?Date: 04/06/2021 ?Prepared by: Cheral Almas ?  ?Exercises ?- Seated Upper Trapezius Stretch  - 1 x daily - 7 x weekly - 1 sets - 5 reps - 5 hold ?- Neck Rotation Stretch  - 1 x daily - 7 x weekly - 1 sets - 5 reps - 5 hold ?4 post op exercises:supine flexion and stargazer, sitting scapular retraction, standing wall slide for abduction ?Person educated: Patient ?Education method: Explanation, Demonstration, and Handouts ?Education comprehension: verbalized understanding ?  ?  ?HOME EXERCISE PROGRAM: ?4 post op exercises:supine flex and stargazer, Scapular retraction, wall walk abduction, Right SB and rotation neck stretches., supine wand flexion and scaption ?  ?ASSESSMENT: ?  ?CLINICAL IMPRESSION: ?Pt seems to have most muscular limitations from guarding.  Improvements after discussion, and scapular mobilizations and more active movements.   ?  ?OBJECTIVE IMPAIRMENTS increased edema, postural dysfunction, and pain, ROM limitations  ?  ?ACTIVITY LIMITATIONS  cleaning, and anything requiring reaching, work activities .  ?  ?PERSONAL FACTORS  mild language barrier( [place]  are also affecting patient's functional outcome.  ?  ?  ?REHAB POTENTIAL: Good ?  ?CLINICAL DECISION MAKING: Stable/uncomplicated ?  ?EVALUATION COMPLEXITY: Low ?  ?GOALS: ?Goals reviewed with patient? Yes ?  ?LONG TERM GOALS: ?  ?Pt will be independent in self breast MLD to decrease swelling ?Baseline:  ?Target date: 05/18/2021 ?Goal status: INITIAL ?  ?2.  Pt will have decreased pain and swelling by 50% or more ?Baseline:  ?Target date: 05/18/2021 ?Goal status: INITIAL ?  ?3.  Pt will have left shoulder flexion and scaption 125 for improved reaching ?Baseline:  ?Target date: 05/18/2021 ?Goal status: INITIAL ?   ?4.  Pt will demonstrate full left shoulder ROM when compared with the right ?Baseline:  ?Target date: 05/18/2021 ?Goal status: INITIAL ?  ?5.  Quick dash will be no greater than 15% ?Baseline: 36 ?Target date: 05/18/2021 ?Goal status: INITIAL ?  ?6.  Pt will have appropriate sports/compression bra to decrease left breast edema ?Baseline: none ?Target date: 05/18/2021 ?Goal status: INITIAL ?  ?PLAN: ?PT FREQUENCY: 1-2x/week ?  ?PT DURATION: 6 weeks ?  ?PLANNED INTERVENTIONS: Therapeutic exercises, Patient/Family education, Manual lymph drainage, Vasopneumatic device, and Manual therapy ?  ?PLAN FOR NEXT SESSION: STM to pecs/UT/Lats etc (check about radiation), PROM, check bra, instruct pt in left breast MLD ?  ?  ? ? ? ? ?Stark Bray, PT ?04/13/2021, 8:52 AM ? ?  ? ?

## 2021-04-14 ENCOUNTER — Ambulatory Visit
Admission: RE | Admit: 2021-04-14 | Discharge: 2021-04-14 | Disposition: A | Discharge status: Home / Self Care | Payer: 59 | Source: Ambulatory Visit | Attending: Radiation Oncology | Admitting: Radiation Oncology

## 2021-04-14 ENCOUNTER — Ambulatory Visit: Payer: 59

## 2021-04-14 DIAGNOSIS — Z510 Encounter for antineoplastic radiation therapy: Secondary | ICD-10-CM | POA: Diagnosis not present

## 2021-04-17 ENCOUNTER — Ambulatory Visit: Payer: 59

## 2021-04-17 ENCOUNTER — Inpatient Hospital Stay (HOSPITAL_BASED_OUTPATIENT_CLINIC_OR_DEPARTMENT_OTHER): Payer: 59 | Admitting: Hematology and Oncology

## 2021-04-17 ENCOUNTER — Other Ambulatory Visit: Payer: Self-pay

## 2021-04-17 ENCOUNTER — Encounter: Payer: Self-pay | Admitting: Hematology and Oncology

## 2021-04-17 ENCOUNTER — Ambulatory Visit
Admission: RE | Admit: 2021-04-17 | Discharge: 2021-04-17 | Disposition: A | Discharge status: Home / Self Care | Payer: 59 | Source: Ambulatory Visit | Attending: Radiation Oncology | Admitting: Radiation Oncology

## 2021-04-17 DIAGNOSIS — Z170 Estrogen receptor positive status [ER+]: Secondary | ICD-10-CM | POA: Insufficient documentation

## 2021-04-17 DIAGNOSIS — C50912 Malignant neoplasm of unspecified site of left female breast: Secondary | ICD-10-CM

## 2021-04-17 DIAGNOSIS — D0512 Intraductal carcinoma in situ of left breast: Secondary | ICD-10-CM | POA: Insufficient documentation

## 2021-04-17 DIAGNOSIS — Z803 Family history of malignant neoplasm of breast: Secondary | ICD-10-CM | POA: Insufficient documentation

## 2021-04-17 DIAGNOSIS — Z510 Encounter for antineoplastic radiation therapy: Secondary | ICD-10-CM | POA: Diagnosis not present

## 2021-04-17 MED ORDER — TAMOXIFEN CITRATE 20 MG PO TABS: 20.0000 mg | ORAL_TABLET | Freq: Every day | ORAL | 3 refills | Status: DC

## 2021-04-17 NOTE — Assessment & Plan Note (Signed)
?  Pathology review: I discussed with the patient the difference between DCIS and invasive breast cancer. It is considered a precancerous lesion. DCIS is classified as a Stage 0 breast cancer. It is generally detected through mammograms as calcifications. We discussed the significance of grades and its impact on prognosis. We also discussed the importance of ER and PR receptors and their implications to adjuvant treatment options. Prognosis of DCIS dependence on grade and degree of comedo necrosis. It is anticipated that if not treated, 20-30% of DCIS can develop into invasive breast cancer. ? ?Recommendation: ?1. Breast conserving surgery ?2. Followed by adjuvant radiation therapy ?3. Followed by antiestrogen therapy with tamoxifen/aromatase inhibitors based on menopausal status ?5 years ?We have once again discussed about antiestrogen therapy in detail today.  We have discussed about tamoxifen and aromatase inhibitors, mechanism of action, adverse effects with each class.  She is leaning towards tamoxifen since she has had a few people who are on tamoxifen and tolerating it well.  We have discussed about adverse effects of tamoxifen including but not limited to postmenopausal symptoms, increased risk of DVT/PE, increased risk of uterine cancer, cardiovascular events etc. ? ?She still has about 3 weeks of adjuvant radiation.  After completion of adjuvant radiation she will start tamoxifen and return to clinic in 3 to 4 months.  She understands that tamoxifen is likely inferior when compared to aromatase inhibitors. ? ?

## 2021-04-17 NOTE — Progress Notes (Signed)
[place] ?CONSULT NOTE ? ?Patient Care Team: ?Glenis Smoker, MD as PCP - General (Family Medicine) ?Jovita Kussmaul, MD as Consulting Physician (General Surgery) ?Mauro Kaufmann, RN as Oncology Nurse Navigator ?Rockwell Germany, RN as Oncology Nurse Navigator ? ?CHIEF COMPLAINTS/PURPOSE OF CONSULTATION:  ?DCIS ? ?ASSESSMENT & PLAN:  ?No problem-specific Assessment & Plan notes found for this encounter. ? ?No orders of the defined types were placed in this encounter. ?HISTORY OF PRESENTING ILLNESS:  ?Frances Patton 56 y.o. female is here because of left breast DCIS ? ?Screening mammogram 09/16/2020 showed group of calcifications and 2 possible distortions in the left breast, diagnostic mammogram and possibly ultrasound of the left breast recommended. ?01/18/2021 diagnostic mammogram of the left breast showed suspicious area of distortion with calcifications in the lateral left breast, no evidence of left axillary lymphadenopathy. 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  Prior prognostics showed ER/PR positive tumor ?She is now undergoing adjuvant radiation. ? ?She has 3 more weeks left of adjuvant radiation.  She denies any new complaints.  She is understandably anxious about taking antiestrogen therapy.  Rest of the pertinent 10 point ROS reviewed and negative ? ? ?MEDICAL HISTORY:  ?Past Medical History:  ?Diagnosis Date  ? Cancer ([place]) 02/03/2021  ? Left breast DCIS  ? GERD (gastroesophageal reflux disease)   ? Headache   ? PCOS  (polycystic ovarian syndrome)   ? ? ?SURGICAL HISTORY: ?Past Surgical History:  ?Procedure Laterality Date  ? BREAST BIOPSY Left 01/26/2021  ? stereo biopsy/ x clip/ path pending  ? BREAST LUMPECTOMY WITH RADIOACTIVE SEED LOCALIZATION Left 02/24/2021  ? Procedure: LEFT BREAST LUMPECTOMY WITH RADIOACTIVE SEED LOCALIZATION;  Surgeon: Jovita Kussmaul, MD;  Location: [place];  Service: General;  Laterality: Left;  ? COLONOSCOPY    ? ? ?SOCIAL HISTORY: ?Social History  ? ?Socioeconomic History  ? Marital status: Married  ?  Spouse name: Not on file  ? Number of children: Not on file  ? Years of education: Not on file  ? Highest education level: Not on file  ?Occupational History  ? Not on file  ?Tobacco Use  ? Smoking status: Never  ? Smokeless tobacco: Never  ?Vaping Use  ? Vaping Use: Never used  ?Substance and Sexual Activity  ? Alcohol use: Never  ? Drug use: Never  ? Sexual activity: Not on file  ?Other Topics Concern  ? Not on file  ?Social History Narrative  ? Not on file  ? ?Social Determinants of Health  ? ?Financial Resource Strain: Not on file  ?Food Insecurity: Not on file  ?Transportation Needs: Not on file  ?Physical Activity: Not on file  ?Stress: Not on file  ?Social Connections: Not on file  ?Intimate Partner Violence: Not on file  ? ? ?FAMILY HISTORY: ?Family History  ?Problem Relation Age of Onset  ? Breast cancer Mother   ? Prostate cancer Father   ? ?Mom had it in 60/70 ?Dad died from metastatic prostate cancer at 74. ? ?ALLERGIES:  is allergic to latex. ? ?MEDICATIONS:  ?Current Outpatient Medications  ?Medication Sig Dispense Refill  ?  b complex vitamins capsule Take by mouth daily.    ? ibuprofen (ADVIL) 200 MG tablet Take by mouth.    ? magnesium 30 MG tablet Take by mouth 2 (two) times daily.    ? omeprazole (PRILOSEC) 10 MG capsule Take by mouth.    ? oxyCODONE (ROXICODONE) 5 MG immediate release tablet Take 1 tablet (5 mg total) by mouth every 4 (four) hours as needed for  severe pain. 15 tablet 0  ? pyridOXINE (VITAMIN B-6) 100 MG tablet Take 100 mg by mouth daily.    ? VITAMIN D PO Take by mouth.    ? ?No current facility-administered medications for this visit.  ? ? ? ?PHYSICAL EXAMINATION: ?ECOG PERFORMANCE STATUS: 0 - Asymptomatic ? ?Vitals:  ? 04/17/21 1150  ?BP: 136/71  ?Pulse: 87  ?Resp: 16  ?Temp: 97.9 ?F (36.6 ?C)  ?SpO2: 99%  ? ?Filed Weights  ? 04/17/21 1150  ?Weight: 175 lb 14.4 oz (79.8 kg)  ? ?Physical exam deferred in lieu of counseling ? ?LABORATORY DATA:  ?I have reviewed the data as listed ?No results found for: WBC, HGB, HCT, MCV, PLT ?  Chemistry   ?No results found for: NA, K, CL, CO2, BUN, CREATININE, GLU No results found for: CALCIUM, ALKPHOS, AST, ALT, BILITOT  ? ? ? ?RADIOGRAPHIC STUDIES: ?I have personally reviewed the radiological images as listed and agreed with the findings in the report. ?No results found. ? ?All questions were answered. The patient knows to call the clinic with any problems, questions or concerns. ?I spent 60 minutes in the care of this patient including H and P, review of records, counseling and coordination of care. ? ?  ? Benay Pike, MD ?04/17/2021 11:53 AM ? ? ? ?

## 2021-04-18 ENCOUNTER — Ambulatory Visit: Payer: 59 | Attending: Radiation Oncology | Admitting: Rehabilitation

## 2021-04-18 ENCOUNTER — Ambulatory Visit
Admission: RE | Admit: 2021-04-18 | Discharge: 2021-04-18 | Disposition: A | Discharge status: Home / Self Care | Payer: 59 | Source: Ambulatory Visit | Attending: Radiation Oncology | Admitting: Radiation Oncology

## 2021-04-18 ENCOUNTER — Ambulatory Visit: Payer: 59

## 2021-04-18 ENCOUNTER — Encounter: Payer: Self-pay | Admitting: Rehabilitation

## 2021-04-18 DIAGNOSIS — C50912 Malignant neoplasm of unspecified site of left female breast: Secondary | ICD-10-CM

## 2021-04-18 DIAGNOSIS — Z170 Estrogen receptor positive status [ER+]: Secondary | ICD-10-CM | POA: Diagnosis present

## 2021-04-18 DIAGNOSIS — Z483 Aftercare following surgery for neoplasm: Secondary | ICD-10-CM

## 2021-04-18 DIAGNOSIS — Z510 Encounter for antineoplastic radiation therapy: Secondary | ICD-10-CM | POA: Diagnosis not present

## 2021-04-18 DIAGNOSIS — M25612 Stiffness of left shoulder, not elsewhere classified: Secondary | ICD-10-CM

## 2021-04-18 DIAGNOSIS — I890 Lymphedema, not elsewhere classified: Secondary | ICD-10-CM

## 2021-04-18 NOTE — Therapy (Signed)
?OUTPATIENT PHYSICAL THERAPY TREATMENT NOTE ? ? ?Patient Name: Frances Patton ?MRN: [id number] ?DOB:May 18, 1965, 56 y.o., female ?Today's Date: 04/18/2021 ? ?PCP: Glenis Smoker, MD ?REFERRING PROVIDER: Glenis Smoker, * ? ? PT End of Session - 04/18/21 [phone]   ? ? Visit Number 4   ? Number of Visits 12   ? Date for PT Re-Evaluation 05/18/21   ? PT Start Time [phone]   ? PT Stop Time [id number]   ? PT Time Calculation (min) 35 min   ? Activity Tolerance Patient tolerated treatment well   ? Behavior During Therapy [facility] for tasks assessed/performed   ? ?  ?  ? ?  ? ? ? ? ?Past Medical History:  ?Diagnosis Date  ? Cancer ([place]) 02/03/2021  ? Left breast DCIS  ? GERD (gastroesophageal reflux disease)   ? Headache   ? PCOS (polycystic ovarian syndrome)   ? ?Past Surgical History:  ?Procedure Laterality Date  ? BREAST BIOPSY Left 01/26/2021  ? stereo biopsy/ x clip/ path pending  ? BREAST LUMPECTOMY WITH RADIOACTIVE SEED LOCALIZATION Left 02/24/2021  ? Procedure: LEFT BREAST LUMPECTOMY WITH RADIOACTIVE SEED LOCALIZATION;  Surgeon: Jovita Kussmaul, MD;  Location: [place];  Service: General;  Laterality: Left;  ? COLONOSCOPY    ? ?Patient Active Problem List  ? Diagnosis Date Noted  ? Malignant neoplasm of left breast in female, estrogen receptor positive ([place]) 02/01/2021  ? ? ?REFERRING DIAG: left breast lymphedema, Decreased left shoulder ROM ? ?THERAPY DIAG:  ?Malignant neoplasm of left breast in female, estrogen receptor positive, unspecified site of breast ([place]) ? ?Aftercare following surgery for neoplasm ? ?Stiffness of left shoulder, not elsewhere classified ? ?Lymphedema, not elsewhere classified ? ?PERTINENT HISTORY: Pt is s/p Left breast lumpectomy for DCIS. It was ER+, PR+.  No LN's were removed.She is pending radiation ?PRECAUTIONS: post surgical, No LN's removed ? ?SUBJECTIVE: it is getting better overall.  Radiation is going well ? ?PAIN:  ?Are you having pain? Not at rest; 3/10 with  stretching. Only when I stretch my hand or reach ? ?UPPER EXTREMITY AROM/PROM: ?  ?A/PROM RIGHT  04/06/2021 ?   ?Shoulder extension 57  ?Shoulder flexion 156  ?Shoulder abduction 165  ?Shoulder internal rotation 65 (FIR bra line)  ?Shoulder external rotation 105  ?                        (Blank rows = not tested) ?  ?A/PROM LEFT  04/06/2021 04/13/21 04/18/21  ?Shoulder extension 34    ?Shoulder flexion 124 120 140  ?Shoulder abduction 83 122 155  ?Shoulder internal rotation FIR left gluts    ?Shoulder external rotation      ?                        (Blank rows = not tested) ?  ?  ?CERVICAL AROM: ?All within functional limits: Very tight in left UT with SB and rotation ?  ?UPPER EXTREMITY STRENGTH: NT due to pain ?  ?  ?LYMPHEDEMA ASSESSMENTS:  ?  ?SURGERY TYPE/DATE: Left lumpectomy for DCIS 02/24/2021 ?  ?NUMBER OF LYMPH NODES REMOVED: 0 ?  ?CHEMOTHERAPY: no ?  ?RADIATION:yes ?  ?HORMONE TREATMENT: yes ?  ?INFECTIONS: no ?    ?TODAY'S TREATMENT  ?04/18/21 ?Pulleys flexion and abduction x 69mn each with cueing and instruction ?Wall ball flexion 5" x 6 ?Yellow band retraction x 10 and ext x 10  bil, bil ER x 10 all with initial cueing as needed  ?Supine wand flexion x10 ?PROM left shoulder flexion, scaption, abduction, ER with no vcs for relaxation today. [place] AP glides in neutral x 60" and then inferior glides at 90deg of flexion straight arm 3x60" and then flexion with inferior glide held which decreased anterior deltoid region pain. Rt  neck lateral flexion with trigger point release Lt UT with edu on self release.  ?Sidelying:  abduction AROM and scapular active assisted scapular motion: pro/ret and elevation/depression x 10 each ?Held STM due to radiation post therapy ? ?04/13/21 ?Remeasured AROM ?Pulleys flexion and abduction x 6mn each with cueing and instruction ?UT stretch Lt 3x20", levator stretch 3x20" left  ?PROM left shoulder flexion, scaption, abduction, ER with vcs for relaxation throughout and education on how at  this point she will not damage anything with movement and stretching and encouraged ADLs.   ?Supine wand flexion and scaption x 10, trunk rotation with legs to left x 4 with goal post arms 6" x 5  ?Sidelying:  abduction AROM and scapular pro/ret and elevation/depression x 5 each with manual cueing ?Held MT due to radiation post therapy ? ?04/11/2021 ?MT; STM to left pectoralis, lats, UT in supine ?PROM left shoulder flexion, scaption, abduction, ER ?Supine wand flexion and scaption x 3, trunk rotation with legs to left x 4 ?MOVZ:CHYIpt. permission short neck, bilateral axillary LN's, left inguinal LN's,left breast redirecting to pathways and verbally instructing in role of lymphatics at same time ? ?Exercises ?- Seated Upper Trapezius Stretch  - 1 x daily - 7 x weekly - 1 sets - 5 reps - 5 hold ?- Neck Rotation Stretch  - 1 x daily - 7 x weekly - 1 sets - 5 reps - 5 hold ?4 post op exercises:supine flexion and stargazer, sitting scapular retraction, standing wall slide for abduction ?Person educated: Patient ?Education method: Explanation, Demonstration, and Handouts ?Education comprehension: verbalized understanding ?  ?  ?HOME EXERCISE PROGRAM: ?4 post op exercises:supine flex and stargazer, Scapular retraction, wall walk abduction, Right SB and rotation neck stretches., supine wand flexion and scaption ?  ?ASSESSMENT: ?  ?CLINICAL IMPRESSION: ?Pt much improved today. Improving AROM each day and reports of ease of movement during ADLs. Less guarding and able to do some gentle postural TE today.    ?  ?OBJECTIVE IMPAIRMENTS increased edema, postural dysfunction, and pain, ROM limitations  ?  ?ACTIVITY LIMITATIONS  cleaning, and anything requiring reaching, work activities .  ?  ?PERSONAL FACTORS  mild language barrier( [place]  are also affecting patient's functional outcome.  ?  ?  ?REHAB POTENTIAL: Good ?  ?CLINICAL DECISION MAKING: Stable/uncomplicated ?  ?EVALUATION COMPLEXITY: Low ?  ?GOALS: ?Goals  reviewed with patient? Yes ?  ?LONG TERM GOALS: ?  ?Pt will be independent in self breast MLD to decrease swelling ?Baseline:  ?Target date: 05/18/2021 ?Goal status: INITIAL ?  ?2.  Pt will have decreased pain and swelling by 50% or more ?Baseline:  ?Target date: 05/18/2021 ?Goal status: INITIAL ?  ?3.  Pt will have left shoulder flexion and scaption 125 for improved reaching ?Baseline:  ?Target date: 05/18/2021 ?Goal status: INITIAL ?  ?4.  Pt will demonstrate full left shoulder ROM when compared with the right ?Baseline:  ?Target date: 05/18/2021 ?Goal status: INITIAL ?  ?5.  Quick dash will be no greater than 15% ?Baseline: 36 ?Target date: 05/18/2021 ?Goal status: INITIAL ?  ?6.  Pt will have appropriate sports/compression  bra to decrease left breast edema ?Baseline: none ?Target date: 05/18/2021 ?Goal status: INITIAL ?  ?PLAN: ?PT FREQUENCY: 1-2x/week ?  ?PT DURATION: 6 weeks ?  ?PLANNED INTERVENTIONS: Therapeutic exercises, Patient/Family education, Manual lymph drainage, Vasopneumatic device, and Manual therapy ?  ?PLAN FOR NEXT SESSION: STM to pecs/UT/Lats etc (check about radiation), PROM, check bra, instruct pt in left breast MLD ?  ?  ? ? ? ? ?Stark Bray, PT ?04/18/2021, 8:43 AM ? ?  ? ?

## 2021-04-19 ENCOUNTER — Ambulatory Visit: Payer: 59

## 2021-04-19 ENCOUNTER — Other Ambulatory Visit: Payer: Self-pay

## 2021-04-19 ENCOUNTER — Ambulatory Visit
Admission: RE | Admit: 2021-04-19 | Discharge: 2021-04-19 | Disposition: A | Discharge status: Home / Self Care | Payer: 59 | Source: Ambulatory Visit | Attending: Radiation Oncology | Admitting: Radiation Oncology

## 2021-04-19 DIAGNOSIS — Z510 Encounter for antineoplastic radiation therapy: Secondary | ICD-10-CM | POA: Diagnosis not present

## 2021-04-20 ENCOUNTER — Ambulatory Visit: Payer: 59

## 2021-04-20 ENCOUNTER — Ambulatory Visit
Admission: RE | Admit: 2021-04-20 | Discharge: 2021-04-20 | Disposition: A | Discharge status: Home / Self Care | Payer: 59 | Source: Ambulatory Visit | Attending: Radiation Oncology | Admitting: Radiation Oncology

## 2021-04-20 DIAGNOSIS — Z510 Encounter for antineoplastic radiation therapy: Secondary | ICD-10-CM | POA: Diagnosis not present

## 2021-04-20 DIAGNOSIS — C50912 Malignant neoplasm of unspecified site of left female breast: Secondary | ICD-10-CM

## 2021-04-20 DIAGNOSIS — M25612 Stiffness of left shoulder, not elsewhere classified: Secondary | ICD-10-CM

## 2021-04-20 DIAGNOSIS — I890 Lymphedema, not elsewhere classified: Secondary | ICD-10-CM

## 2021-04-20 DIAGNOSIS — Z483 Aftercare following surgery for neoplasm: Secondary | ICD-10-CM

## 2021-04-20 NOTE — Therapy (Signed)
?OUTPATIENT PHYSICAL THERAPY TREATMENT NOTE ? ? ?Patient Name: Frances Patton ?MRN: [id number] ?DOB:16-Jul-1965, 56 y.o., female ?Today's Date: 04/20/2021 ? ?PCP: Glenis Smoker, MD ?REFERRING PROVIDER: Hayden Pedro,* ? ? PT End of Session - 04/20/21 1507   ? ? Visit Number 5   ? Number of Visits 12   ? Date for PT Re-Evaluation 05/18/21   ? PT Start Time 1508   ? PT Stop Time 1550   ? PT Time Calculation (min) 42 min   ? Activity Tolerance Patient tolerated treatment well   ? Behavior During Therapy [facility] for tasks assessed/performed   ? ?  ?  ? ?  ? ? ? ? ?Past Medical History:  ?Diagnosis Date  ? Cancer ([place]) 02/03/2021  ? Left breast DCIS  ? GERD (gastroesophageal reflux disease)   ? Headache   ? PCOS (polycystic ovarian syndrome)   ? ?Past Surgical History:  ?Procedure Laterality Date  ? BREAST BIOPSY Left 01/26/2021  ? stereo biopsy/ x clip/ path pending  ? BREAST LUMPECTOMY WITH RADIOACTIVE SEED LOCALIZATION Left 02/24/2021  ? Procedure: LEFT BREAST LUMPECTOMY WITH RADIOACTIVE SEED LOCALIZATION;  Surgeon: Jovita Kussmaul, MD;  Location: [place];  Service: General;  Laterality: Left;  ? COLONOSCOPY    ? ?Patient Active Problem List  ? Diagnosis Date Noted  ? Malignant neoplasm of left breast in female, estrogen receptor positive ([place]) 02/01/2021  ? ? ?REFERRING DIAG: left breast lymphedema, Decreased left shoulder ROM ? ?THERAPY DIAG:  ?Malignant neoplasm of left breast in female, estrogen receptor positive, unspecified site of breast ([place]) ? ?Aftercare following surgery for neoplasm ? ?Stiffness of left shoulder, not elsewhere classified ? ?Lymphedema, not elsewhere classified ? ?PERTINENT HISTORY: Pt is s/p Left breast lumpectomy for DCIS. It was ER+, PR+.  No LN's were removed.She is pending radiation ?PRECAUTIONS: post surgical, No LN's removed ? ?SUBJECTIVE: it is getting better overall. I can reach further.  I had radiation this morning. I got the compression bra and it is  comfortable. ? ?PAIN:  ?Are you having pain? Not at rest; and can reach better without pain ? ?UPPER EXTREMITY AROM/PROM: ?  ?A/PROM RIGHT  04/06/2021 ?   ?Shoulder extension 57  ?Shoulder flexion 156  ?Shoulder abduction 165  ?Shoulder internal rotation 65 (FIR bra line)  ?Shoulder external rotation 105  ?                        (Blank rows = not tested) ?  ?A/PROM LEFT  04/06/2021 04/13/21 04/18/21  ?Shoulder extension 34    ?Shoulder flexion 124 120 140  ?Shoulder abduction 83 122 155  ?Shoulder internal rotation FIR left gluts    ?Shoulder external rotation      ?                        (Blank rows = not tested) ?  ?  ?CERVICAL AROM: ?All within functional limits: Very tight in left UT with SB and rotation ?  ?UPPER EXTREMITY STRENGTH: NT due to pain ?  ?  ?LYMPHEDEMA ASSESSMENTS:  ?  ?SURGERY TYPE/DATE: Left lumpectomy for DCIS 02/24/2021 ?  ?NUMBER OF LYMPH NODES REMOVED: 0 ?  ?CHEMOTHERAPY: no ?  ?RADIATION:yes ?  ?HORMONE TREATMENT: yes ?  ?INFECTIONS: no ?    ?TODAY'S TREATMENT  ?04/20/2021 ?MANUAL: soft tissue mobilization to left pectorals, UT, and in SL left scapular area with cocoa butter secondary  to already having radiation today ?[place] mobilizations posterior and inferior gr 3,PROM left shoulder in flexion, abduction, ER, D2 flexion ?THERAPEUTIC EXERCISE: supine wand flexion and scaption x 5, star gazer x 5 ?Supine alphabet A-z, protraction/retraction x 10, rhythmic stabs 90 and 120 degrees x 30 sec with manual resistance ?AROM bilateral flexion, scaption, horizontal abd x 5, yellow theraband Bilateral scapular retraction and shoulder extension and bilateral ER x 10 ? ?04/18/21 ?Pulleys flexion and abduction x 34mn each with cueing and instruction ?Wall ball flexion 5" x 6 ?Yellow band retraction x 10 and ext x 10 bil, bil ER x 10 all with initial cueing as needed  ?Supine wand flexion x10 ?PROM left shoulder flexion, scaption, abduction, ER with no vcs for relaxation today. [place] glides in neutral x 60" and then  inferior glides at 90deg of flexion straight arm 3x60" and then flexion with inferior glide held which decreased anterior deltoid region pain. Rt  neck lateral flexion with trigger point release Lt UT with edu on self release.  ?Sidelying:  abduction AROM and scapular active assisted scapular motion: pro/ret and elevation/depression x 10 each ?Held STM due to radiation post therapy ? ?04/13/21 ?Remeasured AROM ?Pulleys flexion and abduction x 259m each with cueing and instruction ?UT stretch Lt 3x20", levator stretch 3x20" left  ?PROM left shoulder flexion, scaption, abduction, ER with vcs for relaxation throughout and education on how at this point she will not damage anything with movement and stretching and encouraged ADLs.   ?Supine wand flexion and scaption x 10, trunk rotation with legs to left x 4 with goal post arms 6" x 5  ?Sidelying:  abduction AROM and scapular pro/ret and elevation/depression x 5 each with manual cueing ?Held MT due to radiation post therapy ? ?04/11/2021 ?MT; STM to left pectoralis, lats, UT in supine ?PROM left shoulder flexion, scaption, abduction, ER ?Supine wand flexion and scaption x 3, trunk rotation with legs to left x 4 ?MLXMI:WOEHt. permission short neck, bilateral axillary LN's, left inguinal LN's,left breast redirecting to pathways and verbally instructing in role of lymphatics at same time ? ?Exercises ?- Seated Upper Trapezius Stretch  - 1 x daily - 7 x weekly - 1 sets - 5 reps - 5 hold ?- Neck Rotation Stretch  - 1 x daily - 7 x weekly - 1 sets - 5 reps - 5 hold ?4 post op exercises:supine flexion and stargazer, sitting scapular retraction, standing wall slide for abduction ?Person educated: Patient ?Education method: Explanation, Demonstration, and Handouts ?Education comprehension: verbalized understanding ?  ?  ?HOME EXERCISE PROGRAM: ?4 post op exercises:supine flex and stargazer, Scapular retraction, wall walk abduction, Right SB and rotation neck stretches., supine  wand flexion and scaption ?  ?ASSESSMENT: ?  ?CLINICAL IMPRESSION: ?ROM continues to improve and pt is using her left arm more at home for regular activities. Good tolerance for AROM and strengthening activities without increased pain. Tenderness still present in left UT/pectorals/scapular region. ?  ?OBJECTIVE IMPAIRMENTS increased edema, postural dysfunction, and pain, ROM limitations  ?  ?ACTIVITY LIMITATIONS  cleaning, and anything requiring reaching, work activities .  ?  ?PERSONAL FACTORS  mild language barrier( [place]  are also affecting patient's functional outcome.  ?  ?  ?REHAB POTENTIAL: Good ?  ?CLINICAL DECISION MAKING: Stable/uncomplicated ?  ?EVALUATION COMPLEXITY: Low ?  ?GOALS: ?Goals reviewed with patient? Yes ?  ?LONG TERM GOALS: ?  ?Pt will be independent in self breast MLD to decrease swelling ?Baseline:  ?Target date:  05/18/2021 ?Goal status: INITIAL ?  ?2.  Pt will have decreased pain and swelling by 50% or more ?Baseline:  ?Target date: 05/18/2021 ?Goal status: INITIAL ?  ?3.  Pt will have left shoulder flexion and scaption 125 for improved reaching ?Baseline:  ?Target date: 05/18/2021 ?Goal status: INITIAL ?  ?4.  Pt will demonstrate full left shoulder ROM when compared with the right ?Baseline:  ?Target date: 05/18/2021 ?Goal status: INITIAL ?  ?5.  Quick dash will be no greater than 15% ?Baseline: 36 ?Target date: 05/18/2021 ?Goal status: INITIAL ?  ?6.  Pt will have appropriate sports/compression bra to decrease left breast edema ?Baseline: none ?Target date: 05/18/2021 ?Goal status: INITIAL ?  ?PLAN: ?PT FREQUENCY: 1-2x/week ?  ?PT DURATION: 6 weeks ?  ?PLANNED INTERVENTIONS: Therapeutic exercises, Patient/Family education, Manual lymph drainage, Vasopneumatic device, and Manual therapy ?  ?PLAN FOR NEXT SESSION: STM to pecs/UT/Lats etc (check about radiation), PROM, check bra, instruct pt in left breast MLD ?  ?  ? ? ? ? ?Claris Pong, PT ?04/20/2021, 3:55 PM ? ?  ? ?

## 2021-04-21 ENCOUNTER — Other Ambulatory Visit: Payer: Self-pay

## 2021-04-21 ENCOUNTER — Ambulatory Visit
Admission: RE | Admit: 2021-04-21 | Discharge: 2021-04-21 | Disposition: A | Discharge status: Home / Self Care | Payer: 59 | Source: Ambulatory Visit | Attending: Radiation Oncology | Admitting: Radiation Oncology

## 2021-04-21 ENCOUNTER — Ambulatory Visit: Payer: 59

## 2021-04-21 DIAGNOSIS — Z510 Encounter for antineoplastic radiation therapy: Secondary | ICD-10-CM | POA: Diagnosis not present

## 2021-04-24 ENCOUNTER — Ambulatory Visit: Payer: 59

## 2021-04-24 ENCOUNTER — Other Ambulatory Visit: Payer: Self-pay

## 2021-04-24 ENCOUNTER — Ambulatory Visit
Admission: RE | Admit: 2021-04-24 | Discharge: 2021-04-24 | Disposition: A | Discharge status: Home / Self Care | Payer: 59 | Source: Ambulatory Visit | Attending: Radiation Oncology | Admitting: Radiation Oncology

## 2021-04-24 ENCOUNTER — Ambulatory Visit
Admission: RE | Admit: 2021-04-24 | Discharge: 2021-04-24 | Disposition: A | Discharge status: Home / Self Care | Payer: 59 | Source: Ambulatory Visit | Attending: Gastroenterology | Admitting: Gastroenterology

## 2021-04-24 DIAGNOSIS — R109 Unspecified abdominal pain: Secondary | ICD-10-CM

## 2021-04-24 DIAGNOSIS — Z510 Encounter for antineoplastic radiation therapy: Secondary | ICD-10-CM | POA: Diagnosis not present

## 2021-04-24 MED ORDER — IOPAMIDOL (ISOVUE-300) INJECTION 61%
100.0000 mL | Freq: Once | INTRAVENOUS | Status: AC | PRN
  Administered 2021-04-24: 100 mL via INTRAVENOUS

## 2021-04-25 ENCOUNTER — Ambulatory Visit
Admission: RE | Admit: 2021-04-25 | Discharge: 2021-04-25 | Disposition: A | Discharge status: Home / Self Care | Payer: 59 | Source: Ambulatory Visit | Attending: Radiation Oncology | Admitting: Radiation Oncology

## 2021-04-25 ENCOUNTER — Ambulatory Visit: Payer: 59

## 2021-04-25 DIAGNOSIS — Z510 Encounter for antineoplastic radiation therapy: Secondary | ICD-10-CM | POA: Diagnosis not present

## 2021-04-26 ENCOUNTER — Ambulatory Visit
Admission: RE | Admit: 2021-04-26 | Discharge: 2021-04-26 | Disposition: A | Discharge status: Home / Self Care | Payer: 59 | Source: Ambulatory Visit | Attending: Radiation Oncology | Admitting: Radiation Oncology

## 2021-04-26 ENCOUNTER — Ambulatory Visit: Payer: 59

## 2021-04-26 ENCOUNTER — Other Ambulatory Visit: Payer: Self-pay

## 2021-04-26 DIAGNOSIS — Z510 Encounter for antineoplastic radiation therapy: Secondary | ICD-10-CM | POA: Diagnosis not present

## 2021-04-27 ENCOUNTER — Ambulatory Visit
Admission: RE | Admit: 2021-04-27 | Discharge: 2021-04-27 | Disposition: A | Discharge status: Home / Self Care | Payer: 59 | Source: Ambulatory Visit | Attending: Radiation Oncology | Admitting: Radiation Oncology

## 2021-04-27 ENCOUNTER — Ambulatory Visit: Payer: 59

## 2021-04-27 DIAGNOSIS — Z510 Encounter for antineoplastic radiation therapy: Secondary | ICD-10-CM | POA: Diagnosis not present

## 2021-04-28 ENCOUNTER — Ambulatory Visit: Payer: 59

## 2021-04-28 ENCOUNTER — Other Ambulatory Visit: Payer: Self-pay

## 2021-04-28 ENCOUNTER — Ambulatory Visit
Admission: RE | Admit: 2021-04-28 | Discharge: 2021-04-28 | Disposition: A | Discharge status: Home / Self Care | Payer: 59 | Source: Ambulatory Visit | Attending: Radiation Oncology | Admitting: Radiation Oncology

## 2021-04-28 ENCOUNTER — Ambulatory Visit: Payer: 59 | Admitting: Radiation Oncology

## 2021-04-28 DIAGNOSIS — Z510 Encounter for antineoplastic radiation therapy: Secondary | ICD-10-CM | POA: Diagnosis not present

## 2021-05-01 ENCOUNTER — Other Ambulatory Visit: Payer: Self-pay

## 2021-05-01 ENCOUNTER — Ambulatory Visit: Payer: 59

## 2021-05-01 ENCOUNTER — Encounter: Payer: Self-pay | Admitting: Hematology and Oncology

## 2021-05-01 ENCOUNTER — Ambulatory Visit
Admission: RE | Admit: 2021-05-01 | Discharge: 2021-05-01 | Disposition: A | Discharge status: Home / Self Care | Payer: 59 | Source: Ambulatory Visit | Attending: Radiation Oncology | Admitting: Radiation Oncology

## 2021-05-01 DIAGNOSIS — Z510 Encounter for antineoplastic radiation therapy: Secondary | ICD-10-CM | POA: Diagnosis not present

## 2021-05-02 ENCOUNTER — Other Ambulatory Visit: Payer: Self-pay

## 2021-05-02 ENCOUNTER — Ambulatory Visit: Payer: 59

## 2021-05-02 ENCOUNTER — Ambulatory Visit
Admission: RE | Admit: 2021-05-02 | Discharge: 2021-05-02 | Disposition: A | Discharge status: Home / Self Care | Payer: 59 | Source: Ambulatory Visit | Attending: Radiation Oncology | Admitting: Radiation Oncology

## 2021-05-02 DIAGNOSIS — Z483 Aftercare following surgery for neoplasm: Secondary | ICD-10-CM

## 2021-05-02 DIAGNOSIS — I890 Lymphedema, not elsewhere classified: Secondary | ICD-10-CM

## 2021-05-02 DIAGNOSIS — C50912 Malignant neoplasm of unspecified site of left female breast: Secondary | ICD-10-CM | POA: Diagnosis not present

## 2021-05-02 DIAGNOSIS — Z510 Encounter for antineoplastic radiation therapy: Secondary | ICD-10-CM | POA: Diagnosis not present

## 2021-05-02 LAB — RAD ONC ARIA SESSION SUMMARY
Course Elapsed Days: 21
Plan Fractions Treated to Date: 16
Plan Prescribed Dose Per Fraction: 2.66 Gy
Plan Total Fractions Prescribed: 16
Plan Total Prescribed Dose: 42.56 Gy
Reference Point Dosage Given to Date: 42.56 Gy
Reference Point Session Dosage Given: 2.66 Gy
Session Number: 16

## 2021-05-02 NOTE — Therapy (Signed)
?OUTPATIENT PHYSICAL THERAPY TREATMENT NOTE ? ? ?Patient Name: Frances Patton ?MRN: [id number] ?DOB:01/12/1966, 56 y.o., female ?Today's Date: 05/02/2021 ? ?PCP: Glenis Smoker, MD ?REFERRING PROVIDER: Hayden Pedro,* ? ? PT End of Session - 05/02/21 1251   ? ? Visit Number 6   ? Number of Visits 12   ? Date for PT Re-Evaluation 05/18/21   ? PT Start Time 7017   ? Activity Tolerance Patient tolerated treatment well   ? Behavior During Therapy [facility] for tasks assessed/performed   ? ?  ?  ? ?  ? ? ? ? ?Past Medical History:  ?Diagnosis Date  ? Cancer ([place]) 02/03/2021  ? Left breast DCIS  ? GERD (gastroesophageal reflux disease)   ? Headache   ? PCOS (polycystic ovarian syndrome)   ? ?Past Surgical History:  ?Procedure Laterality Date  ? BREAST BIOPSY Left 01/26/2021  ? stereo biopsy/ x clip/ path pending  ? BREAST LUMPECTOMY WITH RADIOACTIVE SEED LOCALIZATION Left 02/24/2021  ? Procedure: LEFT BREAST LUMPECTOMY WITH RADIOACTIVE SEED LOCALIZATION;  Surgeon: Jovita Kussmaul, MD;  Location: [place];  Service: General;  Laterality: Left;  ? COLONOSCOPY    ? ?Patient Active Problem List  ? Diagnosis Date Noted  ? Malignant neoplasm of left breast in female, estrogen receptor positive ([place]) 02/01/2021  ? ? ?REFERRING DIAG: left breast lymphedema, Decreased left shoulder ROM ? ?THERAPY DIAG:  ?Malignant neoplasm of left breast in female, estrogen receptor positive, unspecified site of breast ([place]) ? ?Aftercare following surgery for neoplasm ? ?Lymphedema, not elsewhere classified ? ?PERTINENT HISTORY: Pt is s/p Left breast lumpectomy for DCIS. It was ER+, PR+.  No LN's were removed.She is pending radiation ?PRECAUTIONS: post surgical, No LN's removed ? ?SUBJECTIVE: My breast is so sore from the radiation. I have 4 more treatments left. Not sure if the breast is swelling or not, but its really sore at the lateral breast. ? ?PAIN:  ?Are you having pain?  Yes , 6/10 left breast from  radiation ? ?Observations; increased darkening and sensitivity of skin in axillary region and incision are of areola. Palpable firmness at areola incision with sensitivity. ? ?UPPER EXTREMITY AROM/PROM: ?  ?A/PROM RIGHT  04/06/2021 ?   ?Shoulder extension 57  ?Shoulder flexion 156  ?Shoulder abduction 165  ?Shoulder internal rotation 65 (FIR bra line)  ?Shoulder external rotation 105  ?                        (Blank rows = not tested) ?  ?A/PROM LEFT  04/06/2021 04/13/21 04/18/21 04/1536/2023  ?Shoulder extension 34   53  ?Shoulder flexion 124 120 140 149  ?Shoulder abduction 83 122 155 167  ?Shoulder internal rotation FIR left gluts   T9  ?Shoulder external rotation     90  ?                        (Blank rows = not tested) ?  ?  ?CERVICAL AROM: ?All within functional limits: Very tight in left UT with SB and rotation ?  ?UPPER EXTREMITY STRENGTH: NT due to pain ?  ?  ?LYMPHEDEMA ASSESSMENTS:  ?  ?SURGERY TYPE/DATE: Left lumpectomy for DCIS 02/24/2021 ?  ?NUMBER OF LYMPH NODES REMOVED: 0 ?  ?CHEMOTHERAPY: no ?  ?RADIATION:yes ?  ?HORMONE TREATMENT: yes ?  ?INFECTIONS: no ?    ?TODAY'S TREATMENT  ?05/02/2021 ?Measured shoulder ROM and Reassessed goals.Assessed left  breast. ?Progressed strength;Yellow theraband Scapular retraction, extension and bilateral ER x 10., Elbow flexion 2lbs x 10, 3 lbs x 10, 3 way raises 0 lbs x 10, supine horizontal abduction yellow x 10, supine alphabet A-z x 1 with 2 lbs, serratus punch 2 lbs x 10, Rhythmic stabilization 2 x 30 sec with manual resistance, lower trunk rotation x 3 to the right ? ? ? ?04/20/2021 ?MANUAL: soft tissue mobilization to left pectorals, UT, and in SL left scapular area with cocoa butter secondary to already having radiation today ?[place] mobilizations posterior and inferior gr 3,PROM left shoulder in flexion, abduction, ER, D2 flexion ?THERAPEUTIC EXERCISE: supine wand flexion and scaption x 5, star gazer x 5 ?Supine alphabet A-z, protraction/retraction x 10, rhythmic stabs  90 and 120 degrees x 30 sec with manual resistance ?AROM bilateral flexion, scaption, horizontal abd x 5, yellow theraband Bilateral scapular retraction and shoulder extension and bilateral ER x 10 ? ?04/18/21 ?Pulleys flexion and abduction x 18mn each with cueing and instruction ?Wall ball flexion 5" x 6 ?Yellow band retraction x 10 and ext x 10 bil, bil ER x 10 all with initial cueing as needed  ?Supine wand flexion x10 ?PROM left shoulder flexion, scaption, abduction, ER with no vcs for relaxation today. [place] glides in neutral x 60" and then inferior glides at 90deg of flexion straight arm 3x60" and then flexion with inferior glide held which decreased anterior deltoid region pain. Rt  neck lateral flexion with trigger point release Lt UT with edu on self release.  ?Sidelying:  abduction AROM and scapular active assisted scapular motion: pro/ret and elevation/depression x 10 each ?Held STM due to radiation post therapy ? ?04/13/21 ?Remeasured AROM ?Pulleys flexion and abduction x 268m each with cueing and instruction ?UT stretch Lt 3x20", levator stretch 3x20" left  ?PROM left shoulder flexion, scaption, abduction, ER with vcs for relaxation throughout and education on how at this point she will not damage anything with movement and stretching and encouraged ADLs.   ?Supine wand flexion and scaption x 10, trunk rotation with legs to left x 4 with goal post arms 6" x 5  ?Sidelying:  abduction AROM and scapular pro/ret and elevation/depression x 5 each with manual cueing ?Held MT due to radiation post therapy ? ?04/11/2021 ?MT; STM to left pectoralis, lats, UT in supine ?PROM left shoulder flexion, scaption, abduction, ER ?Supine wand flexion and scaption x 3, trunk rotation with legs to left x 4 ?MLZOX:WRUEt. permission short neck, bilateral axillary LN's, left inguinal LN's,left breast redirecting to pathways and verbally instructing in role of lymphatics at same time ? ?Exercises ?- Seated Upper Trapezius Stretch   - 1 x daily - 7 x weekly - 1 sets - 5 reps - 5 hold ?- Neck Rotation Stretch  - 1 x daily - 7 x weekly - 1 sets - 5 reps - 5 hold ?4 post op exercises:supine flexion and stargazer, sitting scapular retraction, standing wall slide for abduction ?Person educated: Patient ?Education method: Explanation, Demonstration, and Handouts ?Education comprehension: verbalized understanding ?  ?  ?HOME EXERCISE PROGRAM: ?4 post op exercises:supine flex and stargazer, Scapular retraction, wall walk abduction, Right SB and rotation neck stretches., supine wand flexion and scaption ?  ?ASSESSMENT: ?  ?CLINICAL IMPRESSION: ?Pt is making excellent progress with AROM left shoulder goals and improved pain.  She is nearing the end of radiation and is very sore at the lateral breast. There is skin darkening and fibrosis at incision with mild  swelling still present. Pt will be placed on hold until May 4 for skin to heal from radiation so we may start MLD and instruct pt. ? ?  ?OBJECTIVE IMPAIRMENTS increased edema, postural dysfunction, and pain, ROM limitations  ?  ?ACTIVITY LIMITATIONS  cleaning, and anything requiring reaching, work activities .  ?  ?PERSONAL FACTORS  mild language barrier( [place]  are also affecting patient's functional outcome.  ?  ?  ?REHAB POTENTIAL: Good ?  ?CLINICAL DECISION MAKING: Stable/uncomplicated ?  ?EVALUATION COMPLEXITY: Low ?  ?GOALS: ?Goals reviewed with patient? Yes ?  ?LONG TERM GOALS: ?  ?Pt will be independent in self breast MLD to decrease swelling ?Baseline:  ?Target date: 05/18/2021 ?Goal status: Ongoing ?  ?2.  Pt will have decreased pain and swelling by 50% or more ?Baseline:  ?Target date: 05/18/2021 ?Goal status: Achieved for pain ?3.  Pt will have left shoulder flexion and scaption 125 for improved reaching ?Baseline:  ?Target date: 05/18/2021 ?Goal status: Achieved  ?4.  Pt will demonstrate full left shoulder ROM when compared with the right ?Baseline: (nearly 05/02/2021) ?Target  date: 05/18/2021 ?Goal status: ongoing ?  ?5.  Quick dash will be no greater than 15% ?Baseline: 36 ?Target date: 05/18/2021 ?Goal status: INITIAL ?  ?6.  Pt will have appropriate sports/compression bra to decrease left bre

## 2021-05-03 ENCOUNTER — Ambulatory Visit: Payer: 59

## 2021-05-03 ENCOUNTER — Other Ambulatory Visit: Payer: Self-pay

## 2021-05-03 ENCOUNTER — Ambulatory Visit
Admission: RE | Admit: 2021-05-03 | Discharge: 2021-05-03 | Disposition: A | Discharge status: Home / Self Care | Payer: 59 | Source: Ambulatory Visit | Attending: Radiation Oncology | Admitting: Radiation Oncology

## 2021-05-03 DIAGNOSIS — Z510 Encounter for antineoplastic radiation therapy: Secondary | ICD-10-CM | POA: Diagnosis not present

## 2021-05-03 LAB — RAD ONC ARIA SESSION SUMMARY
Course Elapsed Days: 22
Plan Fractions Treated to Date: 1
Plan Prescribed Dose Per Fraction: 2.5 Gy
Plan Total Fractions Prescribed: 4
Plan Total Prescribed Dose: 10 Gy
Reference Point Dosage Given to Date: 45.06 Gy
Reference Point Session Dosage Given: 2.5 Gy
Session Number: 17

## 2021-05-04 ENCOUNTER — Other Ambulatory Visit: Payer: Self-pay

## 2021-05-04 ENCOUNTER — Ambulatory Visit: Payer: 59

## 2021-05-04 ENCOUNTER — Encounter: Payer: Self-pay | Admitting: *Deleted

## 2021-05-04 ENCOUNTER — Ambulatory Visit
Admission: RE | Admit: 2021-05-04 | Discharge: 2021-05-04 | Disposition: A | Discharge status: Home / Self Care | Payer: 59 | Source: Ambulatory Visit | Attending: Radiation Oncology | Admitting: Radiation Oncology

## 2021-05-04 DIAGNOSIS — Z510 Encounter for antineoplastic radiation therapy: Secondary | ICD-10-CM | POA: Diagnosis not present

## 2021-05-04 DIAGNOSIS — C50912 Malignant neoplasm of unspecified site of left female breast: Secondary | ICD-10-CM

## 2021-05-04 LAB — RAD ONC ARIA SESSION SUMMARY
Course Elapsed Days: 23
Plan Fractions Treated to Date: 2
Plan Prescribed Dose Per Fraction: 2.5 Gy
Plan Total Fractions Prescribed: 4
Plan Total Prescribed Dose: 10 Gy
Reference Point Dosage Given to Date: 47.56 Gy
Reference Point Session Dosage Given: 2.5 Gy
Session Number: 18

## 2021-05-05 ENCOUNTER — Ambulatory Visit
Admission: RE | Admit: 2021-05-05 | Discharge: 2021-05-05 | Disposition: A | Discharge status: Home / Self Care | Payer: 59 | Source: Ambulatory Visit | Attending: Radiation Oncology | Admitting: Radiation Oncology

## 2021-05-05 ENCOUNTER — Ambulatory Visit: Payer: 59

## 2021-05-05 ENCOUNTER — Other Ambulatory Visit: Payer: Self-pay

## 2021-05-05 DIAGNOSIS — Z510 Encounter for antineoplastic radiation therapy: Secondary | ICD-10-CM | POA: Diagnosis not present

## 2021-05-05 LAB — RAD ONC ARIA SESSION SUMMARY
Course Elapsed Days: 24
Plan Fractions Treated to Date: 3
Plan Prescribed Dose Per Fraction: 2.5 Gy
Plan Total Fractions Prescribed: 4
Plan Total Prescribed Dose: 10 Gy
Reference Point Dosage Given to Date: 50.06 Gy
Reference Point Session Dosage Given: 2.5 Gy
Session Number: 19

## 2021-05-08 ENCOUNTER — Encounter: Payer: Self-pay | Admitting: Radiation Oncology

## 2021-05-08 ENCOUNTER — Other Ambulatory Visit: Payer: Self-pay

## 2021-05-08 ENCOUNTER — Ambulatory Visit
Admission: RE | Admit: 2021-05-08 | Discharge: 2021-05-08 | Disposition: A | Discharge status: Home / Self Care | Payer: 59 | Source: Ambulatory Visit | Attending: Radiation Oncology | Admitting: Radiation Oncology

## 2021-05-08 DIAGNOSIS — Z510 Encounter for antineoplastic radiation therapy: Secondary | ICD-10-CM | POA: Diagnosis not present

## 2021-05-08 LAB — RAD ONC ARIA SESSION SUMMARY
Course Elapsed Days: 27
Plan Fractions Treated to Date: 4
Plan Prescribed Dose Per Fraction: 2.5 Gy
Plan Total Fractions Prescribed: 4
Plan Total Prescribed Dose: 10 Gy
Reference Point Dosage Given to Date: 52.56 Gy
Reference Point Session Dosage Given: 2.5 Gy
Session Number: 20

## 2021-05-11 ENCOUNTER — Other Ambulatory Visit: Payer: Self-pay | Admitting: *Deleted

## 2021-05-12 ENCOUNTER — Encounter: Payer: Self-pay | Admitting: Hematology and Oncology

## 2021-05-12 ENCOUNTER — Inpatient Hospital Stay (HOSPITAL_BASED_OUTPATIENT_CLINIC_OR_DEPARTMENT_OTHER): Payer: 59 | Admitting: Hematology and Oncology

## 2021-05-12 ENCOUNTER — Other Ambulatory Visit: Payer: Self-pay

## 2021-05-12 DIAGNOSIS — C50912 Malignant neoplasm of unspecified site of left female breast: Secondary | ICD-10-CM

## 2021-05-12 DIAGNOSIS — Z170 Estrogen receptor positive status [ER+]: Secondary | ICD-10-CM | POA: Diagnosis not present

## 2021-05-12 DIAGNOSIS — Z510 Encounter for antineoplastic radiation therapy: Secondary | ICD-10-CM | POA: Diagnosis not present

## 2021-05-12 NOTE — Progress Notes (Signed)
[place] ?CONSULT NOTE ? ?Patient Care Team: ?Glenis Smoker, MD as PCP - General (Family Medicine) ?Jovita Kussmaul, MD as Consulting Physician (General Surgery) ?Mauro Kaufmann, RN as Oncology Nurse Navigator ?Rockwell Germany, RN as Oncology Nurse Navigator ?Benay Pike, MD as Consulting Physician (Hematology and Oncology) ? ?CHIEF COMPLAINTS/PURPOSE OF CONSULTATION:  ?DCIS ? ?ASSESSMENT & PLAN:  ?Malignant neoplasm of left breast in female, estrogen receptor positive ([place]) ? 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  During her last visit we have discussed about antiestrogen therapy and she has picked up the prescription yet to start taking it.  She has noticed some changes in the left axilla which she was very concerned about and hence made an appointment today.  This appears to be an ingrown hair follicle which was easily removed exam today.  The other changes are consistent with postradiation changes.  She was also requesting if we can extend her disability.  At this time there is no clear indication to extend her disability.  She can consider talking to radiation oncology if she would qualify for this based on recent radiation treatment.  I have discussed this very clearly with her.  She can return to clinic in 3 months. ? ?No orders of the defined types were placed in this encounter. ?HISTORY OF PRESENTING ILLNESS:  ? ?Frances Patton 56 y.o. female is here because of left breast DCIS ? ?Screening mammogram 09/16/2020 showed group of calcifications and 2 possible distortions in the left breast, diagnostic mammogram and possibly ultrasound of the left breast recommended. ?01/18/2021 diagnostic mammogram of the left breast showed suspicious area of distortion with calcifications in the  lateral left breast, no evidence of left axillary lymphadenopathy. 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  Prior prognostics showed ER/PR positive tumor ?She is s/p adjuvant radiation. ? ?She is here for an interim visit because she saw something dark in the armpit and was worried about this. She also says that the breast is very tender, she feels very fatigued and she was wondering if we can extend her disability. ?She has disability till end of May. Rest of the pertinent 10 point ROS reviewed and neg. ? ?MEDICAL HISTORY:  ?Past Medical History:  ?Diagnosis Date  ? Cancer ([place]) 02/03/2021  ? Left breast DCIS  ? GERD (gastroesophageal reflux disease)   ? Headache   ? PCOS (polycystic ovarian syndrome)   ? ? ?SURGICAL HISTORY: ?Past Surgical History:  ?Procedure Laterality Date  ? BREAST BIOPSY Left 01/26/2021  ? stereo biopsy/ x clip/ path pending  ? BREAST LUMPECTOMY WITH RADIOACTIVE SEED LOCALIZATION Left 02/24/2021  ? Procedure: LEFT BREAST LUMPECTOMY WITH RADIOACTIVE SEED LOCALIZATION;  Surgeon: Jovita Kussmaul, MD;  Location: [facility];  Service: General;  Laterality: Left;  ? COLONOSCOPY    ? ? ?SOCIAL HISTORY: ?Social History  ? ?Socioeconomic History  ? Marital status: Married  ?  Spouse name: Not on file  ? Number  of children: Not on file  ? Years of education: Not on file  ? Highest education level: Not on file  ?Occupational History  ? Not on file  ?Tobacco Use  ? Smoking status: Never  ? Smokeless tobacco: Never  ?Vaping Use  ? Vaping  Use: Never used  ?Substance and Sexual Activity  ? Alcohol use: Never  ? Drug use: Never  ? Sexual activity: Not on file  ?Other Topics Concern  ? Not on file  ?Social History Narrative  ? Not on file  ? ?Social Determinants of Health  ? ?Financial Resource Strain: Not on file  ?Food Insecurity: Not on file  ?Transportation Needs: Not on file  ?Physical Activity: Not on file  ?Stress: Not on file  ?Social Connections: Not on file  ?Intimate Partner Violence: Not on file  ? ? ?FAMILY HISTORY: ?Family History  ?Problem Relation Age of Onset  ? Breast cancer Mother   ? Prostate cancer Father   ? ?Mom had it in 60/70 ?Dad died from metastatic prostate cancer at 49. ? ?ALLERGIES:  is allergic to latex. ? ?MEDICATIONS:  ?Current Outpatient Medications  ?Medication Sig Dispense Refill  ? b complex vitamins capsule Take by mouth daily.    ? ibuprofen (ADVIL) 200 MG tablet Take by mouth.    ? magnesium 30 MG tablet Take by mouth 2 (two) times daily.    ? omeprazole (PRILOSEC) 10 MG capsule Take by mouth.    ? oxyCODONE (ROXICODONE) 5 MG immediate release tablet Take 1 tablet (5 mg total) by mouth every 4 (four) hours as needed for severe pain. 15 tablet 0  ? pyridOXINE (VITAMIN B-6) 100 MG tablet Take 100 mg by mouth daily.    ? tamoxifen (NOLVADEX) 20 MG tablet Take 1 tablet (20 mg total) by mouth daily. 30 tablet 3  ? VITAMIN D PO Take by mouth.    ? ?No current facility-administered medications for this visit.  ? ? ? ?PHYSICAL EXAMINATION: ?ECOG PERFORMANCE STATUS: 0 - Asymptomatic ? ?Vitals:  ? 05/12/21 1110  ?BP: 129/70  ?Pulse: 78  ?Temp: 97.6 ?F (36.4 ?C)  ?SpO2: 98%  ? ?Filed Weights  ? 05/12/21 1110  ?Weight: 172 lb 1.6 oz (78.1 kg)  ? ?Post rad changes, ingrown hair follicle, no other concerns. ? ?LABORATORY DATA:  ?I have reviewed the data as listed ?No results found for: WBC, HGB, HCT, MCV, PLT ?  Chemistry   ?No results found for: NA, K, CL, CO2, BUN, CREATININE, GLU No results found for: CALCIUM, ALKPHOS, AST,  ALT, BILITOT  ? ? ? ?RADIOGRAPHIC STUDIES: ?I have personally reviewed the radiological images as listed and agreed with the findings in the report. ?CT ABDOMEN PELVIS W CONTRAST ? ?Result Date: 04/24/2021 ?CLINICAL DATA:  Generalized abdominal pain constipation and diarrhea x1 year. History of left breast cancer with lumpectomy and radiation. EXAM: CT ABDOMEN AND PELVIS WITH CONTRAST TECHNIQUE: Multidetector CT imaging of the abdomen and pelvis was performed using the standard protocol following bolus administration of intravenous contrast. RADIATION DOSE REDUCTION: This exam was performed according to the departmental dose-optimization program which includes automated exposure control, adjustment of the mA and/or kV according to patient size and/or use of iterative reconstruction technique. CONTRAST:  132m ISOVUE-300 IOPAMIDOL (ISOVUE-300) INJECTION 61% COMPARISON:  None. FINDINGS: Lower chest: Mosaic attenuation in the lung bases. Hepatobiliary: Hypodense hepatic lesions measuring up to 9 mm in the hepatic dome are technically too small to accurately characterize but measure fluid density and likely  reflects cysts. Cholelithiasis without evidence of acute cholecystitis. Adenomyomatosis of the gallbladder fundus. No biliary ductal dilation. Pancreas: No pancreatic ductal dilation or evidence of acute inflammation. Spleen: No splenomegaly or focal splenic lesion Adrenals/Urinary Tract: Nodularity of the bilateral adrenal glands measuring up to 9 mm which given small size most likely reflect adenomas and require no follow-up. No hydronephrosis. Kidneys demonstrate symmetric enhancement and excretion of contrast. Urinary bladder is unremarkable for degree of distension. Stomach/Bowel: Radiopaque enteric contrast material traverses the splenic flexure. Stomach is unremarkable for degree of distension. No pathologic dilation of small or large bowel. The appendix and terminal ileum appear normal. No evidence of acute  bowel inflammation. Vascular/Lymphatic: Normal caliber abdominal aorta. No pathologically enlarged abdominal or pelvic lymph nodes. Reproductive: Lobular uterine contour likely reflects leiomyomas. No susp

## 2021-05-12 NOTE — Assessment & Plan Note (Signed)
?  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  During her last visit we have discussed about antiestrogen therapy and she has picked up the prescription yet to start taking it.  She has noticed some changes in the left axilla which she was very concerned about and hence made an appointment today.  This appears to be an ingrown hair follicle which was easily removed exam today.  The other changes are consistent with postradiation changes.  She was also requesting if we can extend her disability.  At this time there is no clear indication to extend her disability.  She can consider talking to radiation oncology if she would qualify for this based on recent radiation treatment.  I have discussed this very clearly with her.  She can return to clinic in 3 months. ?

## 2021-05-19 ENCOUNTER — Ambulatory Visit: Payer: 59 | Attending: Radiation Oncology

## 2021-05-19 DIAGNOSIS — Z170 Estrogen receptor positive status [ER+]: Secondary | ICD-10-CM | POA: Insufficient documentation

## 2021-05-19 DIAGNOSIS — C50912 Malignant neoplasm of unspecified site of left female breast: Secondary | ICD-10-CM | POA: Diagnosis present

## 2021-05-19 DIAGNOSIS — M25612 Stiffness of left shoulder, not elsewhere classified: Secondary | ICD-10-CM | POA: Insufficient documentation

## 2021-05-19 DIAGNOSIS — Z483 Aftercare following surgery for neoplasm: Secondary | ICD-10-CM | POA: Insufficient documentation

## 2021-05-19 DIAGNOSIS — I890 Lymphedema, not elsewhere classified: Secondary | ICD-10-CM | POA: Diagnosis present

## 2021-05-19 NOTE — Therapy (Signed)
?OUTPATIENT PHYSICAL THERAPY TREATMENT NOTE ? ? ?Patient Name: Frances Patton ?MRN: [id number] ?DOB:Jun 23, 1965, 56 y.o., female ?Today's Date: 05/19/2021 ? ?PCP: Glenis Smoker, MD ?REFERRING PROVIDER: Hayden Pedro,* ? ? PT End of Session - 05/19/21 1237   ? ? Visit Number 7   ? Number of Visits 15   ? Date for PT Re-Evaluation 06/16/21   ? PT Start Time 1003   ? PT Stop Time 1054   ? PT Time Calculation (min) 51 min   ? Activity Tolerance Patient tolerated treatment well   ? Behavior During Therapy [facility] for tasks assessed/performed   ? ?  ?  ? ?  ? ? ? ? ?Past Medical History:  ?Diagnosis Date  ? Cancer ([place]) 02/03/2021  ? Left breast DCIS  ? GERD (gastroesophageal reflux disease)   ? Headache   ? PCOS (polycystic ovarian syndrome)   ? ?Past Surgical History:  ?Procedure Laterality Date  ? BREAST BIOPSY Left 01/26/2021  ? stereo biopsy/ x clip/ path pending  ? BREAST LUMPECTOMY WITH RADIOACTIVE SEED LOCALIZATION Left 02/24/2021  ? Procedure: LEFT BREAST LUMPECTOMY WITH RADIOACTIVE SEED LOCALIZATION;  Surgeon: Jovita Kussmaul, MD;  Location: [facility];  Service: General;  Laterality: Left;  ? COLONOSCOPY    ? ?Patient Active Problem List  ? Diagnosis Date Noted  ? Malignant neoplasm of left breast in female, estrogen receptor positive ([place]) 02/01/2021  ? ? ?REFERRING DIAG: left breast lymphedema, Decreased left shoulder ROM ? ?THERAPY DIAG:  ?Malignant neoplasm of left breast in female, estrogen receptor positive, unspecified site of breast ([place]) ? ?Aftercare following surgery for neoplasm ? ?Lymphedema, not elsewhere classified ? ?Stiffness of left shoulder, not elsewhere classified ? ?PERTINENT HISTORY: Pt is s/p Left breast lumpectomy for DCIS. It was ER+, PR+.  No LN's were removed.She is pending radiation ?PRECAUTIONS: post surgical, No LN's removed ? ?SUBJECTIVE: Radiation ended 05/08/2021.  My skin is very dark . I don't use the lotion they gave me because it makes me itchy.  I saw  Dr. Chryl Heck and I was having some pain under my arm pit but there was an ingrown hair follicle that she removed. Left lateral breast is sore and swollen and the armpit is sore. ? ?PAIN:  ?Are you having pain?  Yes , 4/10 left breast from radiation, intermittent. Chest hurts when I stretch and reach ? ?Observations; increased darkening and sensitivity of skin in axillary region and incision are of areola.  Skin peeling under armpit but healing,Palpable firmness at areola incision and below with sensitivity. Nipple area very dry ? ?Palpation: very tender sternal and axillary border of pectorals, left UT, lats ? ?UPPER EXTREMITY AROM/PROM: ?  ?A/PROM RIGHT  04/06/2021 ?   ?Shoulder extension 57  ?Shoulder flexion 156  ?Shoulder abduction 165  ?Shoulder internal rotation 65 (FIR bra line)  ?Shoulder external rotation 105  ?                        (Blank rows = not tested) ?  ?A/PROM LEFT  04/06/2021 04/13/21 04/18/21 04/1536/2023 05/19/2021  ?Shoulder extension 34   53 46  ?Shoulder flexion 124 120 140 149 144  ?Shoulder abduction 83 122 155 167 127  ?Shoulder internal rotation FIR left gluts   T9   ?Shoulder external rotation     90 84  ?                        (  Blank rows = not tested) ?  ?  ?CERVICAL AROM: ?All within functional limits: Very tight in left UT with SB and rotation ?  ?UPPER EXTREMITY STRENGTH: NT due to pain ?  ?  ?LYMPHEDEMA ASSESSMENTS:  ?  ?SURGERY TYPE/DATE: Left lumpectomy for DCIS 02/24/2021 ?  ?NUMBER OF LYMPH NODES REMOVED: 0 ?  ?CHEMOTHERAPY: no ?  ?RADIATION:yes ?  ?HORMONE TREATMENT: yes ?  ?INFECTIONS: no ?    ?TODAY'S TREATMENT  ? ?05/19/2021 ?Measured left shoulder area with tightness and decreased ROM in all motions ?MANUAL: soft tissue mobilization to left pectorals, UT, Lats and in SL left scapular area and UT with cocoa butter . Foam pad made to place in compression bra in area of firmness. ?THERAPEUTIC EXERCISE ?AA shoulder flexion and stargazer x 5 ?Lower trunk rotation bilaterally x 5 with arms  outstretched and with goal post arms with towel roll under left UE to decrease ER ? ? ?05/02/2021 ?Measured shoulder ROM and Reassessed goals.Assessed left breast. ?Progressed strength;Yellow theraband Scapular retraction, extension and bilateral ER x 10., Elbow flexion 2lbs x 10, 3 lbs x 10, 3 way raises 0 lbs x 10, supine horizontal abduction yellow x 10, supine alphabet A-z x 1 with 2 lbs, serratus punch 2 lbs x 10, Rhythmic stabilization 2 x 30 sec with manual resistance, lower trunk rotation x 3 to the right ?04/20/2021 ?MANUAL: soft tissue mobilization to left pectorals, UT, and in SL left scapular area with cocoa butter secondary to already having radiation today ?[place] mobilizations posterior and inferior gr 3,PROM left shoulder in flexion, abduction, ER, D2 flexion ?THERAPEUTIC EXERCISE: supine wand flexion and scaption x 5, star gazer x 5 ?Supine alphabet A-z, protraction/retraction x 10, rhythmic stabs 90 and 120 degrees x 30 sec with manual resistance ?AROM bilateral flexion, scaption, horizontal abd x 5, yellow theraband Bilateral scapular retraction and shoulder extension and bilateral ER x 10 ? ?04/18/21 ?Pulleys flexion and abduction x 35mn each with cueing and instruction ?Wall ball flexion 5" x 6 ?Yellow band retraction x 10 and ext x 10 bil, bil ER x 10 all with initial cueing as needed  ?Supine wand flexion x10 ?PROM left shoulder flexion, scaption, abduction, ER with no vcs for relaxation today. [place]'s PointAP glides in neutral x 60" and then inferior glides at 90deg of flexion straight arm 3x60" and then flexion with inferior glide held which decreased anterior deltoid region pain. Rt  neck lateral flexion with trigger point release Lt UT with edu on self release.  ?Sidelying:  abduction AROM and scapular active assisted scapular motion: pro/ret and elevation/depression x 10 each ?Held STM due to radiation post therapy ? ?04/13/21 ?Remeasured AROM ?Pulleys flexion and abduction x 239m each with cueing and  instruction ?UT stretch Lt 3x20", levator stretch 3x20" left  ?PROM left shoulder flexion, scaption, abduction, ER with vcs for relaxation throughout and education on how at this point she will not damage anything with movement and stretching and encouraged ADLs.   ?Supine wand flexion and scaption x 10, trunk rotation with legs to left x 4 with goal post arms 6" x 5  ?Sidelying:  abduction AROM and scapular pro/ret and elevation/depression x 5 each with manual cueing ?Held MT due to radiation post therapy ? ?04/11/2021 ?MT; STM to left pectoralis, lats, UT in supine ?PROM left shoulder flexion, scaption, abduction, ER ?Supine wand flexion and scaption x 3, trunk rotation with legs to left x 4 ?MLTKZ:SWFUt. permission short neck, bilateral axillary LN's, left inguinal  LN's,left breast redirecting to pathways and verbally instructing in role of lymphatics at same time ? ?Exercises ?- Seated Upper Trapezius Stretch  - 1 x daily - 7 x weekly - 1 sets - 5 reps - 5 hold ?- Neck Rotation Stretch  - 1 x daily - 7 x weekly - 1 sets - 5 reps - 5 hold ?4 post op exercises:supine flexion and stargazer, sitting scapular retraction, standing wall slide for abduction ?Person educated: Patient ?Education method: Explanation, Demonstration, and Handouts ?Education comprehension: verbalized understanding ?  ?  ?HOME EXERCISE PROGRAM: ?4 post op exercises:supine flex and stargazer, Scapular retraction, wall walk abduction, Right SB and rotation neck stretches., supine wand flexion and scaption, Lower trunk rotation with arms outstretched and goal post ?  ?ASSESSMENT: ?  ?CLINICAL IMPRESSION: ?Pt completed radiation on April 24th and is experiencing tightness/ pain and limitations in shoulder ROM with continued complaints of breast swelling. Performed soft tissue mobilization to the entire left upper quarter as pt was very tender and advised pt that when she feels tight that is her body asking her to stretch. She will resume stretching  exercises as instructed. She did particularly well with lower trunk rotation stretch and loved the way it felt.  Updated HEP. Left breast continues with fibrosis especially distal and lateral to areola.

## 2021-05-19 NOTE — Patient Instructions (Signed)
Access Code: [email or web address] ?URL: [email or web address] ?Date: 05/19/2021 ?Prepared by: Cheral Almas ? ?Exercises ?- Supine Lower Trunk Rotation  - 2 x daily - 7 x weekly - 1 sets - 3-5 reps ?

## 2021-05-23 ENCOUNTER — Encounter: Payer: Self-pay | Admitting: Pulmonary Disease

## 2021-05-23 ENCOUNTER — Ambulatory Visit: Payer: 59 | Admitting: Pulmonary Disease

## 2021-05-23 VITALS — BP 112/74 | HR 83 | Temp 97.7°F | Ht 65.0 in | Wt 172.6 lb

## 2021-05-23 DIAGNOSIS — G4719 Other hypersomnia: Secondary | ICD-10-CM | POA: Diagnosis not present

## 2021-05-23 DIAGNOSIS — R9389 Abnormal findings on diagnostic imaging of other specified body structures: Secondary | ICD-10-CM | POA: Diagnosis not present

## 2021-05-23 DIAGNOSIS — R0609 Other forms of dyspnea: Secondary | ICD-10-CM | POA: Diagnosis not present

## 2021-05-23 NOTE — Progress Notes (Signed)
? ?      ?AZILE MINARDI    [id number]    Aug 28, 1965 ? ?Primary Care Physician:Timberlake, Anastasia Pall, MD ? ?Referring Physician: Otis Brace, MD ?[address] ?[address],  [place] [address] ? ?Chief complaint:   ?Patient is being seen for abnormal CT scan of the chest showing groundglass changes at the bases of the lungs ?Shortness of breath on exertion ? ?HPI: ? ?She had a CT scan for abdominal discomfort showing bibasal groundglass changes ? ?No prior history of lung disease ? ?Recent diagnosis of breast cancer for which she received radiation treatments, post surgery.  She does have some chest discomfort which is felt to be related to recent surgery ? ?She does admit to shortness of breath on exertion ?No chronic cough ? ?Never smoker ?No pertinent occupational history/predisposition to lung disease ? ?She did have asthma growing up ? ?Admits to snoring, no witnessed apneas, occasional choking episodes ?She does have daytime headaches sometimes unrelated to waking up in the morning ?Usually goes to bed between 11 and 12 ?Wakes up about 7 to 9 AM ?Admits to dryness of her throat in the mornings ? ?She denies a chronic cough ?Denies any musculoskeletal pain apart from that related to recent surgery ?No arthritic pains ?No skin rash ? ?She does have occasional reflux ? ?Outpatient Encounter Medications as of 05/23/2021  ?Medication Sig  ? b complex vitamins capsule Take by mouth daily.  ? ibuprofen (ADVIL) 200 MG tablet Take by mouth.  ? magnesium 30 MG tablet Take by mouth 2 (two) times daily.  ? omeprazole (PRILOSEC) 10 MG capsule Take by mouth.  ? pyridOXINE (VITAMIN B-6) 100 MG tablet Take 100 mg by mouth daily.  ? tamoxifen (NOLVADEX) 20 MG tablet Take 1 tablet (20 mg total) by mouth daily.  ? VITAMIN D PO Take by mouth.  ? [DISCONTINUED] oxyCODONE (ROXICODONE) 5 MG immediate release tablet Take 1 tablet (5 mg total) by mouth every 4 (four) hours as needed for severe pain. (Patient not taking:  Reported on 05/23/2021)  ? ?No facility-administered encounter medications on file as of 05/23/2021.  ? ? ?Allergies as of 05/23/2021 - Review Complete 05/23/2021  ?Allergen Reaction Noted  ? Latex Itching 02/15/2021  ? ? ?Past Medical History:  ?Diagnosis Date  ? Cancer ([place]) 02/03/2021  ? Left breast DCIS  ? GERD (gastroesophageal reflux disease)   ? Headache   ? PCOS (polycystic ovarian syndrome)   ? ? ?Past Surgical History:  ?Procedure Laterality Date  ? BREAST BIOPSY Left 01/26/2021  ? stereo biopsy/ x clip/ path pending  ? BREAST LUMPECTOMY WITH RADIOACTIVE SEED LOCALIZATION Left 02/24/2021  ? Procedure: LEFT BREAST LUMPECTOMY WITH RADIOACTIVE SEED LOCALIZATION;  Surgeon: Jovita Kussmaul, MD;  Location: [facility];  Service: General;  Laterality: Left;  ? COLONOSCOPY    ? ? ?Family History  ?Problem Relation Age of Onset  ? Breast cancer Mother   ? Prostate cancer Father   ? ? ?Social History  ? ?Socioeconomic History  ? Marital status: Married  ?  Spouse name: Not on file  ? Number of children: Not on file  ? Years of education: Not on file  ? Highest education level: Not on file  ?Occupational History  ? Not on file  ?Tobacco Use  ? Smoking status: Never  ? Smokeless tobacco: Never  ?Vaping Use  ? Vaping Use: Never used  ?Substance and Sexual Activity  ? Alcohol use: Never  ?  Drug use: Never  ? Sexual activity: Not on file  ?Other Topics Concern  ? Not on file  ?Social History Narrative  ? Not on file  ? ?Social Determinants of Health  ? ?Financial Resource Strain: Not on file  ?Food Insecurity: Not on file  ?Transportation Needs: Not on file  ?Physical Activity: Not on file  ?Stress: Not on file  ?Social Connections: Not on file  ?Intimate Partner Violence: Not on file  ? ? ?Review of Systems  ?Constitutional:  Negative for fatigue.  ?Respiratory:  Positive for shortness of breath.   ?Psychiatric/Behavioral:  Positive for sleep disturbance.   ? ?Vitals:  ? 05/23/21 1149  ?BP: 112/74  ?Pulse: 83   ?Temp: 97.7 ?F (36.5 ?C)  ?SpO2: 99%  ? ? ? ?Physical Exam ?Constitutional:   ?   Appearance: Normal appearance.  ?HENT:  ?   Head: Normocephalic.  ?   Mouth/Throat:  ?   Mouth: Mucous membranes are moist.  ?Cardiovascular:  ?   Rate and Rhythm: Normal rate and regular rhythm.  ?   Heart sounds: No murmur heard. ?  No friction rub.  ?Pulmonary:  ?   Effort: No respiratory distress.  ?   Breath sounds: No stridor. No wheezing or rhonchi.  ?Musculoskeletal:  ?   Cervical back: No rigidity.  ?Neurological:  ?   Mental Status: She is alert.  ?Psychiatric:     ?   Mood and Affect: Mood normal.  ? ? ? ?Data Reviewed: ?CT scan of the abdomen was reviewed with the patient showing the bibasal groundglass changes ?No other previous CT scans to compare this with ? ?Assessment:  ?Dyspnea on exertion ?-May be multifactorial ? ?Abnormal CT showing bibasal groundglass changes ?-May be related to air trapping ?-Does not have any significant predisposition to interstitial lung disease ? ?History of snoring ?-Dryness of her mouth in the morning, headaches ?-She does have an underlying concern for sleep apnea ? ?Plan/Recommendations: ?Schedule patient for sleep study ? ?Obtain pulmonary function testing ? ?Obtain full lung CT to assess for interstitial changes ? ?Pathophysiology of sleep disordered breathing discussed, treatment options discussed ? ?Tentative follow-up in about 3 months ? ?Encouraged to call with significant concerns ? ? ?Sherrilyn Rist MD ?[facility] ?05/23/2021, 12:19 PM ? ?CC: Brahmbhatt, Parag, MD ? ? ?

## 2021-05-23 NOTE — Progress Notes (Signed)
? ?                                                                                                                                                          ?  Patient Name: Frances Patton ?MRN: [id number] ?DOB: Mar 29, 1965 ?Referring Physician: Sela Hilding ?Date of Service: 05/08/2021 ?[facility], [place] ? ?                                                      End Of Treatment Note ? ?Diagnoses: C79.81-Secondary malignant neoplasm of breast ? 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 ? ?Intent: Curative ? ?Radiation Treatment Dates: 04/11/2021 through 05/08/2021 ?Site Technique Total Dose (Gy) Dose per Fx (Gy) Completed Fx Beam Energies  ?Breast, Left: Breast_L 3D 42.56/42.56 2.66 16/16 6XFFF  ?Breast, Left: Breast_L_Bst 3D 10/10 2.5 4/4 6X, 10X  ? ?Narrative: The patient tolerated radiation therapy relatively well. She developed fatigue and anticipated skin changes in the treatment field. She was also working with PT due to range of motion difficulties and pain due to this.  ? ?Plan: The patient will receive a call in about one month from the radiation oncology department. She will continue follow up with Dr. Chryl Heck as well.  ?________________________________________________ ? ? ? ?Carola Rhine, PAC  ?

## 2021-05-23 NOTE — Patient Instructions (Addendum)
Home sleep test for possible obstructive sleep apnea ? ?CT scan of the chest to compare with previous showing basal groundglass changes ? ?PFT for dyspnea on exertion ? ?I will see you back in about 3 months ? ?Call with significant concerns ? ?Living With Sleep Apnea ?Sleep apnea is a condition in which breathing pauses or becomes shallow during sleep. Sleep apnea is most commonly caused by a collapsed or blocked airway. People with sleep apnea usually snore loudly. They may have times when they gasp and stop breathing for 10 seconds or more during sleep. This may happen many times during the night. ?The breaks in breathing also interrupt the deep sleep that you need to feel rested. Even if you do not completely wake up from the gaps in breathing, your sleep may not be restful and you feel tired during the day. You may also have a headache in the morning and low energy during the day, and you may feel anxious or depressed. ?How can sleep apnea affect me? ?Sleep apnea increases your chances of extreme tiredness during the day (daytime fatigue). It can also increase your risk for health conditions, such as: ?Heart attack. ?Stroke. ?Obesity. ?Type 2 diabetes. ?Heart failure. ?Irregular heartbeat. ?High blood pressure. ?If you have daytime fatigue as a result of sleep apnea, you may be more likely to: ?Perform poorly at school or work. ?Fall asleep while driving. ?Have difficulty with attention. ?Develop depression or anxiety. ?Have sexual dysfunction. ?What actions can I take to manage sleep apnea? ?Sleep apnea treatment ? ?If you were given a device to open your airway while you sleep, use it only as told by your health care provider. You may be given: ?An oral appliance. This is a custom-made mouthpiece that shifts your lower jaw forward. ?A continuous positive airway pressure (CPAP) device. This device blows air through a mask when you breathe out (exhale). ?A nasal expiratory positive airway pressure (EPAP)  device. This device has valves that you put into each nostril. ?A bi-level positive airway pressure (BIPAP) device. This device blows air through a mask when you breathe in (inhale) and breathe out (exhale). ?You may need surgery if other treatments do not work for you. ?Sleep habits ?Go to sleep and wake up at the same time every day. This helps set your internal clock (circadian rhythm) for sleeping. ?If you stay up later than usual, such as on weekends, try to get up in the morning within 2 hours of your normal wake time. ?Try to get at least 7-9 hours of sleep each night. ?Stop using a computer, tablet, and mobile phone a few hours before bedtime. ?Do not take long naps during the day. If you nap, limit it to 30 minutes. ?Have a relaxing bedtime routine. Reading or listening to music may relax you and help you sleep. ?Use your bedroom only for sleep. ?Keep your television and computer out of your bedroom. ?Keep your bedroom cool, dark, and quiet. ?Use a supportive mattress and pillows. ?Follow your health care provider's instructions for other changes to sleep habits. ?Nutrition ?Do not eat heavy meals in the evening. ?Do not have caffeine in the later part of the day. The effects of caffeine can last for more than 5 hours. ?Follow your health care provider's or dietitian's instructions for any diet changes. ?Lifestyle ? ?  ? ?Do not drink alcohol before bedtime. Alcohol can cause you to fall asleep at first, but then it can cause you to wake up in the middle  of the night and have trouble getting back to sleep. ?Do not use any products that contain nicotine or tobacco. These products include cigarettes, chewing tobacco, and vaping devices, such as e-cigarettes. If you need help quitting, ask your health care provider. ?Medicines ?Take over-the-counter and prescription medicines only as told by your health care provider. ?Do not use over-the-counter sleep medicine. You can become dependent on this medicine, and  it can make sleep apnea worse. ?Do not use medicines, such as sedatives and narcotics, unless told by your health care provider. ?Activity ?Exercise on most days, but avoid exercising in the evening. Exercising near bedtime can interfere with sleeping. ?If possible, spend time outside every day. Natural light helps regulate your circadian rhythm. ?General information ?Lose weight if you need to, and maintain a healthy weight. ?Keep all follow-up visits. This is important. ?If you are having surgery, make sure to tell your health care provider that you have sleep apnea. You may need to bring your device with you. ?Where to find more information ?Learn more about sleep apnea and daytime fatigue from: ?American Sleep Association: sleepassociation.org ?National Sleep Foundation: sleepfoundation.org ?National Heart, Lung, and Blood Institute: https://www.hartman-hill.biz/ ?Summary ?Sleep apnea is a condition in which breathing pauses or becomes shallow during sleep. ?Sleep apnea can cause daytime fatigue and other serious health conditions. ?You may need to wear a device while sleeping to help keep your airway open. ?If you are having surgery, make sure to tell your health care provider that you have sleep apnea. You may need to bring your device with you. ?Making changes to sleep habits, diet, lifestyle, and activity can help you manage sleep apnea. ?This information is not intended to replace advice given to you by your health care provider. Make sure you discuss any questions you have with your health care provider. ?Document Revised: 08/10/2020 Document Reviewed: 12/11/2019 ?Elsevier Patient Education ? [place]. ? ?

## 2021-05-30 ENCOUNTER — Ambulatory Visit: Payer: 59

## 2021-05-30 DIAGNOSIS — C50912 Malignant neoplasm of unspecified site of left female breast: Secondary | ICD-10-CM

## 2021-05-30 DIAGNOSIS — I890 Lymphedema, not elsewhere classified: Secondary | ICD-10-CM

## 2021-05-30 DIAGNOSIS — Z483 Aftercare following surgery for neoplasm: Secondary | ICD-10-CM

## 2021-05-30 DIAGNOSIS — M25612 Stiffness of left shoulder, not elsewhere classified: Secondary | ICD-10-CM

## 2021-05-30 NOTE — Therapy (Signed)
?OUTPATIENT PHYSICAL THERAPY TREATMENT NOTE ? ? ?Patient Name: Frances Patton ?MRN: [id number] ?DOB:1965/03/06, 56 y.o., female ?Today's Date: 05/30/2021 ? ?PCP: Glenis Smoker, MD ?REFERRING PROVIDER: Hayden Pedro,* ? ? PT End of Session - 05/30/21 1302   ? ? Visit Number 8   ? Number of Visits 15   ? Date for PT Re-Evaluation 06/16/21   ? PT Start Time 1303   ? PT Stop Time 7829   ? PT Time Calculation (min) 50 min   ? Activity Tolerance Patient tolerated treatment well   ? Behavior During Therapy [facility] for tasks assessed/performed   ? ?  ?  ? ?  ? ? ? ? ?Past Medical History:  ?Diagnosis Date  ? Cancer ([place]) 02/03/2021  ? Left breast DCIS  ? GERD (gastroesophageal reflux disease)   ? Headache   ? PCOS (polycystic ovarian syndrome)   ? ?Past Surgical History:  ?Procedure Laterality Date  ? BREAST BIOPSY Left 01/26/2021  ? stereo biopsy/ x clip/ path pending  ? BREAST LUMPECTOMY WITH RADIOACTIVE SEED LOCALIZATION Left 02/24/2021  ? Procedure: LEFT BREAST LUMPECTOMY WITH RADIOACTIVE SEED LOCALIZATION;  Surgeon: Jovita Kussmaul, MD;  Location: [place];  Service: General;  Laterality: Left;  ? COLONOSCOPY    ? ?Patient Active Problem List  ? Diagnosis Date Noted  ? Malignant neoplasm of left breast in female, estrogen receptor positive ([place]) 02/01/2021  ? ? ?REFERRING DIAG: left breast lymphedema, Decreased left shoulder ROM ? ?THERAPY DIAG:  ?Malignant neoplasm of left breast in female, estrogen receptor positive, unspecified site of breast ([place]) ? ?Aftercare following surgery for neoplasm ? ?Lymphedema, not elsewhere classified ? ?Stiffness of left shoulder, not elsewhere classified ? ?PERTINENT HISTORY: Pt is s/p Left breast lumpectomy for DCIS. It was ER+, PR+.  No LN's were removed.She is pending radiation ?PRECAUTIONS: post surgical, No LN's removed ? ?SUBJECTIVE: I am starting to feel looser in my shoulder again, and I do yoga 3 times per week.  The chest muscle doesn't hurt  anymore and I can reach with my arm without pain in my breast.  My breast is dry and itchy and there is some firmness in several areas of my left breast. I will go back to work in June, but on call only. I am taking a class now at [facility] [employer] [facility]. ? ? ? PAIN:  ?Are you having pain?  No pain at present.  ? ? ?Observations; increased darkening and sensitivity of skin in axillary region and incision are of areola.  Skin peeling under armpit but healing,Palpable firmness at areola incision and below with sensitivity. Nipple area very dry ? ?Palpation: very tender sternal and axillary border of pectorals, left UT, lats ? ?UPPER EXTREMITY AROM/PROM: ?  ?A/PROM RIGHT  04/06/2021 ?   ?Shoulder extension 57  ?Shoulder flexion 156  ?Shoulder abduction 165  ?Shoulder internal rotation 65 (FIR bra line)  ?Shoulder external rotation 105  ?                        (Blank rows = not tested) ?  ?A/PROM LEFT  04/06/2021 04/13/21 04/18/21 04/1536/2023 05/19/2021 05/30/2021  ?Shoulder extension 34   53 46 56  ?Shoulder flexion 124 120 140 149 144 153  ?Shoulder abduction 83 122 155 167 127 157  ?Shoulder internal rotation FIR left gluts   T9    ?Shoulder external rotation     90 84   ?                        (  Blank rows = not tested) ?  ?  ?CERVICAL AROM: ?All within functional limits: Very tight in left UT with SB and rotation ?  ?UPPER EXTREMITY STRENGTH: NT due to pain ?  ?  ?LYMPHEDEMA ASSESSMENTS:  ?  ?SURGERY TYPE/DATE: Left lumpectomy for DCIS 02/24/2021 ?  ?NUMBER OF LYMPH NODES REMOVED: 0 ?  ?CHEMOTHERAPY: no ?  ?RADIATION:yes ?  ?HORMONE TREATMENT: yes ?  ?INFECTIONS: no ?    ?TODAY'S TREATMENT  ? ?05/30/2021 ?Reassessed left shoulder ROM with excellent improvement ?Performed left breast MLD on Pt.; supraclavicular, 5 breaths, Activated Bilateral axillary LN's and left inguinal LN's, anterior interaxillary pathway, axillo-inguinal pathway and left breast redirecting to pathways and retracing all steps. Pt was instructed in same and performed all  steps. She was given illustrated and written instructions. ?Pt applied a very small amount of cocoa butter to right upper chest area of itching and a small amount to lower nipple.  She wanted to try to see if it would help itching, and if so she will purchase some. ? ? ?05/19/2021 ?Measured left shoulder area with tightness and decreased ROM in all motions ?MANUAL: soft tissue mobilization to left pectorals, UT, Lats and in SL left scapular area and UT with cocoa butter . Foam pad made to place in compression bra in area of firmness. ?THERAPEUTIC EXERCISE ?AA shoulder flexion and stargazer x 5 ?Lower trunk rotation bilaterally x 5 with arms outstretched and with goal post arms with towel roll under left UE to decrease ER ? ? ?05/02/2021 ?Measured shoulder ROM and Reassessed goals.Assessed left breast. ?Progressed strength;Yellow theraband Scapular retraction, extension and bilateral ER x 10., Elbow flexion 2lbs x 10, 3 lbs x 10, 3 way raises 0 lbs x 10, supine horizontal abduction yellow x 10, supine alphabet A-z x 1 with 2 lbs, serratus punch 2 lbs x 10, Rhythmic stabilization 2 x 30 sec with manual resistance, lower trunk rotation x 3 to the right ?04/20/2021 ?MANUAL: soft tissue mobilization to left pectorals, UT, and in SL left scapular area with cocoa butter secondary to already having radiation today ?[place] mobilizations posterior and inferior gr 3,PROM left shoulder in flexion, abduction, ER, D2 flexion ?THERAPEUTIC EXERCISE: supine wand flexion and scaption x 5, star gazer x 5 ?Supine alphabet A-z, protraction/retraction x 10, rhythmic stabs 90 and 120 degrees x 30 sec with manual resistance ?AROM bilateral flexion, scaption, horizontal abd x 5, yellow theraband Bilateral scapular retraction and shoulder extension and bilateral ER x 10 ? ? ? ?Exercises ?- Seated Upper Trapezius Stretch  - 1 x daily - 7 x weekly - 1 sets - 5 reps - 5 hold ?- Neck Rotation Stretch  - 1 x daily - 7 x weekly - 1 sets - 5 reps - 5  hold ?4 post op exercises:supine flexion and stargazer, sitting scapular retraction, standing wall slide for abduction ?Person educated: Patient ?Education method: Explanation, Demonstration, and Handouts ?Education comprehension: verbalized understanding ?  ?  ?HOME EXERCISE PROGRAM: ?4 post op exercises:supine flex and stargazer, Scapular retraction, wall walk abduction, Right SB and rotation neck stretches., supine wand flexion and scaption, Lower trunk rotation with arms outstretched and goal post ?  ?ASSESSMENT: ?  ?CLINICAL IMPRESSION: ?Pt has worked hard on ROM and demonstrates excellent improvement in left shoulder ROM with decreased pain. She still has skin itching  and dry skin from radiation, and fibrosis noted in the left breast.PT performed MLD and she was instructed in Left breast MLD. She did very well return  demonstrating all aspects but required intermittent VC's to keep knuckles flat and for proper stretch. She seemed to have a good understanding. She tried a small amount of cocoa butter on left chest region to see if it would help with itching Fibrosis did soften after performing MLD ? ? ? ?  ?OBJECTIVE IMPAIRMENTS increased edema, postural dysfunction, and pain, ROM limitations  ?  ?ACTIVITY LIMITATIONS  cleaning, and anything requiring reaching, work activities .  ?  ?PERSONAL FACTORS  mild language barrier( [place]  are also affecting patient's functional outcome.  ?  ?  ?REHAB POTENTIAL: Good ?  ?CLINICAL DECISION MAKING: Stable/uncomplicated ?  ?EVALUATION COMPLEXITY: Low ?  ?GOALS: ?Goals reviewed with patient? Yes ?  ?LONG TERM GOALS: ?  ?Pt will be independent in self breast MLD to decrease swelling ?Baseline:  ?Target date: 05/18/2021 ?Goal status: Ongoing ?  ?2.  Pt will have decreased pain and swelling by 50% or more ?Baseline:  ?Target date: 05/18/2021 ?Goal status: Achieved for pain ?3.  Pt will have left shoulder flexion and scaption 125 for improved reaching ?Baseline:   ?Target date: 05/18/2021 ?Goal status: Achieved  ?4.  Pt will demonstrate full left shoulder ROM when compared with the right ?Baseline: (nearly 05/02/2021) ?Target date: 05/18/2021 ?Goal status: ongoing ?  ?5.  [address]

## 2021-06-01 ENCOUNTER — Telehealth: Payer: Self-pay | Admitting: Hematology and Oncology

## 2021-06-01 ENCOUNTER — Ambulatory Visit: Payer: 59

## 2021-06-01 DIAGNOSIS — C50912 Malignant neoplasm of unspecified site of left female breast: Secondary | ICD-10-CM

## 2021-06-01 DIAGNOSIS — M25612 Stiffness of left shoulder, not elsewhere classified: Secondary | ICD-10-CM

## 2021-06-01 DIAGNOSIS — I890 Lymphedema, not elsewhere classified: Secondary | ICD-10-CM

## 2021-06-01 DIAGNOSIS — Z483 Aftercare following surgery for neoplasm: Secondary | ICD-10-CM

## 2021-06-01 NOTE — Therapy (Signed)
OUTPATIENT PHYSICAL THERAPY TREATMENT NOTE   Patient Name: Frances Patton MRN: [id number] DOB:[birth year], 56 y.o., female Today's Date: 06/01/2021  PCP: Glenis Smoker, MD REFERRING PROVIDER: Hayden Pedro,*   PT End of Session - 06/01/21 1403     Visit Number 9    Number of Visits 15    Date for PT Re-Evaluation 06/16/21    PT Start Time 7616    PT Stop Time 0737    PT Time Calculation (min) 43 min    Activity Tolerance Patient tolerated treatment well    Behavior During Therapy [facility] for tasks assessed/performed               Past Medical History:  Diagnosis Date   Cancer ([place]) 02/03/2021   Left breast DCIS   GERD (gastroesophageal reflux disease)    Headache    PCOS (polycystic ovarian syndrome)    Past Surgical History:  Procedure Laterality Date   BREAST BIOPSY Left 01/26/2021   stereo biopsy/ x clip/ path pending   BREAST LUMPECTOMY WITH RADIOACTIVE SEED LOCALIZATION Left 02/24/2021   Procedure: LEFT BREAST LUMPECTOMY WITH RADIOACTIVE SEED LOCALIZATION;  Surgeon: Autumn Messing III, MD;  Location: [place];  Service: General;  Laterality: Left;   COLONOSCOPY     Patient Active Problem List   Diagnosis Date Noted   Malignant neoplasm of left breast in female, estrogen receptor positive ([place]) 02/01/2021    REFERRING DIAG: left breast lymphedema, Decreased left shoulder ROM  THERAPY DIAG:  Malignant neoplasm of left breast in female, estrogen receptor positive, unspecified site of breast [facility])  Aftercare following surgery for neoplasm  Lymphedema, not elsewhere classified  Stiffness of left shoulder, not elsewhere classified  PERTINENT HISTORY: Pt is s/p Left breast lumpectomy for DCIS. It was ER+, PR+.  No LN's were removed.She is pending radiation PRECAUTIONS: post surgical, No LN's removed  SUBJECTIVE:  The cocoa butter helped with the itchiness, but it came back the next day. I have tried the MLD at home when I wake  up. I think it is going well.    PAIN:  Are you having pain?  No pain at present.    Observations; increased darkening and sensitivity of skin in axillary region and incision are of areola.  Skin peeling under armpit but healing,Palpable firmness at areola incision and below with sensitivity. Nipple area very dry  Palpation: very tender sternal and axillary border of pectorals, left UT, lats  UPPER EXTREMITY AROM/PROM:   A/PROM RIGHT  04/06/2021    Shoulder extension 57  Shoulder flexion 156  Shoulder abduction 165  Shoulder internal rotation 65 (FIR bra line)  Shoulder external rotation 105                          (Blank rows = not tested)   A/PROM LEFT  04/06/2021 04/13/21 04/18/21 04/1536/2023 05/19/2021 05/30/2021  Shoulder extension 34   53 46 56  Shoulder flexion 124 120 140 149 144 153  Shoulder abduction 83 122 155 167 127 157  Shoulder internal rotation FIR left gluts   T9    Shoulder external rotation     90 84                           (Blank rows = not tested)     CERVICAL AROM: All within functional limits: Very tight in left UT with SB and rotation  UPPER EXTREMITY STRENGTH: NT due to pain     LYMPHEDEMA ASSESSMENTS:    SURGERY TYPE/DATE: Left lumpectomy for DCIS 02/24/2021   NUMBER OF LYMPH NODES REMOVED: 0   CHEMOTHERAPY: no   RADIATION:yes   HORMONE TREATMENT: yes   INFECTIONS: no     TODAY'S TREATMENT   06/01/2021  Performed left breast MLD on Pt.; supraclavicular, 5 breaths, Activated Bilateral axillary LN's and left inguinal LN's, anterior interaxillary pathway, axillo-inguinal pathway and left breast redirecting to pathways and retracing all steps. Pt reviewed all and performed all steps. She had steps memorized and did not need to look at her instructions. Pt applied a very small amount of cocoa butter to right upper chest area of itching and a small amount to nipple to help itching. Gave pt a small amount of cocoa butter in a cup to try at home  before buying. Pt had several small superficial bumps on her left arm and right breast. She was seeing family DR. today about them.   05/30/2021 Reassessed left shoulder ROM with excellent improvement Performed left breast MLD on Pt.; supraclavicular, 5 breaths, Activated Bilateral axillary LN's and left inguinal LN's, anterior interaxillary pathway, axillo-inguinal pathway and left breast redirecting to pathways and retracing all steps. Pt was instructed in same and performed all steps. She was given illustrated and written instructions. Pt applied a very small amount of cocoa butter to right upper chest area of itching and a small amount to lower nipple.  She wanted to try to see if it would help itching, and if so she will purchase some.   05/19/2021 Measured left shoulder area with tightness and decreased ROM in all motions MANUAL: soft tissue mobilization to left pectorals, UT, Lats and in SL left scapular area and UT with cocoa butter . Foam pad made to place in compression bra in area of firmness. THERAPEUTIC EXERCISE AA shoulder flexion and stargazer x 5 Lower trunk rotation bilaterally x 5 with arms outstretched and with goal post arms with towel roll under left UE to decrease ER  Exercises - Seated Upper Trapezius Stretch  - 1 x daily - 7 x weekly - 1 sets - 5 reps - 5 hold - Neck Rotation Stretch  - 1 x daily - 7 x weekly - 1 sets - 5 reps - 5 hold 4 post op exercises:supine flexion and stargazer, sitting scapular retraction, standing wall slide for abduction Person educated: Patient Education method: Explanation, Demonstration, and Handouts Education comprehension: verbalized understanding     HOME EXERCISE PROGRAM: 4 post op exercises:supine flex and stargazer, Scapular retraction, wall walk abduction, Right SB and rotation neck stretches., supine wand flexion and scaption, Lower trunk rotation with arms outstretched and goal post   ASSESSMENT:   CLINICAL IMPRESSION: Pt did  exceptionally well with self MLD and used good stretch with only occasional VC's for too much pressure and occasional sliding. She did also do the anterior interaxillary pathway and then went backward across the AI pathway.  This was easily corrected. She had an excellent concept of proper sequence.     OBJECTIVE IMPAIRMENTS increased edema, postural dysfunction, and pain, ROM limitations    ACTIVITY LIMITATIONS  cleaning, and anything requiring reaching, work activities .    PERSONAL FACTORS  mild language barrier( [place]  are also affecting patient's functional outcome.      REHAB POTENTIAL: Good   CLINICAL DECISION MAKING: Stable/uncomplicated   EVALUATION COMPLEXITY: Low   GOALS: Goals reviewed with patient? Yes  LONG TERM GOALS:   Pt will be independent in self breast MLD to decrease swelling Baseline:  Target date: 05/18/2021 Goal status: Ongoing   2.  Pt will have decreased pain and swelling by 50% or more Baseline:  Target date: 05/18/2021 Goal status: Achieved for pain 3.  Pt will have left shoulder flexion and scaption 125 for improved reaching Baseline:  Target date: 05/18/2021 Goal status: Achieved  4.  Pt will demonstrate full left shoulder ROM when compared with the right Baseline: (nearly 05/02/2021) Target date: 05/18/2021 Goal status: ongoing   5.  Quick dash will be no greater than 15% Baseline: 36 Target date: 05/18/2021 Goal status: INITIAL   6.  Pt will have appropriate sports/compression bra to decrease left breast edema Baseline: none Target date: 05/18/2021 Goal status: Achieved   PLAN: PT FREQUENCY: 2x/week   PT DURATION: 4 weeks   PLANNED INTERVENTIONS: Therapeutic exercises, Patient/Family education, Manual lymph drainage, Vasopneumatic device, and Manual therapy   PLAN FOR NEXT SESSION:  continue MLD and  review  left breast MLD with pt for swelling, STM prn, how was cocoa butter for dry skin?         Claris Pong,  PT 06/01/2021, 2:52 PM

## 2021-06-01 NOTE — Telephone Encounter (Signed)
.  Called pt per 5/18 inbasket , Patient was unavailable, a message with appt time and date was left with number on file. Req to r/s as scp appt

## 2021-06-06 ENCOUNTER — Ambulatory Visit: Payer: 59

## 2021-06-08 ENCOUNTER — Ambulatory Visit: Payer: 59

## 2021-06-08 DIAGNOSIS — M25612 Stiffness of left shoulder, not elsewhere classified: Secondary | ICD-10-CM

## 2021-06-08 DIAGNOSIS — Z483 Aftercare following surgery for neoplasm: Secondary | ICD-10-CM

## 2021-06-08 DIAGNOSIS — C50912 Malignant neoplasm of unspecified site of left female breast: Secondary | ICD-10-CM

## 2021-06-08 DIAGNOSIS — I890 Lymphedema, not elsewhere classified: Secondary | ICD-10-CM

## 2021-06-08 NOTE — Therapy (Addendum)
OUTPATIENT PHYSICAL THERAPY TREATMENT NOTE   Patient Name: Frances Patton MRN: [id number] DOB:01-12-1966, 56 y.o., female Today's Date: 06/08/2021  PCP: Glenis Smoker, MD REFERRING PROVIDER: Hayden Pedro,*   PT End of Session - 06/08/21 1259     Visit Number 10    Number of Visits 15    Date for PT Re-Evaluation 06/16/21    PT Start Time 1303    PT Stop Time 1320    PT Time Calculation (min) 17 min    Activity Tolerance Patient tolerated treatment well    Behavior During Therapy [facility] for tasks assessed/performed               Past Medical History:  Diagnosis Date   Cancer ([place]) 02/03/2021   Left breast DCIS   GERD (gastroesophageal reflux disease)    Headache    PCOS (polycystic ovarian syndrome)    Past Surgical History:  Procedure Laterality Date   BREAST BIOPSY Left 01/26/2021   stereo biopsy/ x clip/ path pending   BREAST LUMPECTOMY WITH RADIOACTIVE SEED LOCALIZATION Left 02/24/2021   Procedure: LEFT BREAST LUMPECTOMY WITH RADIOACTIVE SEED LOCALIZATION;  Surgeon: Autumn Messing III, MD;  Location: [facility];  Service: General;  Laterality: Left;   COLONOSCOPY     Patient Active Problem List   Diagnosis Date Noted   Malignant neoplasm of left breast in female, estrogen receptor positive ([place]) 02/01/2021    REFERRING DIAG: left breast lymphedema, Decreased left shoulder ROM  THERAPY DIAG:  Malignant neoplasm of left breast in female, estrogen receptor positive, unspecified site of breast [facility])  Aftercare following surgery for neoplasm  Lymphedema, not elsewhere classified  Stiffness of left shoulder, not elsewhere classified  PERTINENT HISTORY: Pt is s/p Left breast lumpectomy for DCIS. It was ER+, PR+.  No LN's were removed.She is pending radiation PRECAUTIONS: post surgical, No LN's removed  SUBJECTIVE:  My left breast is so sore and itchy. I can not touch it because its sore.  I don't think I can do the MLD, its just  too sore. The nipple was red over the weekend but better today. My shoulder is doing well and I don't need to work on it.   PAIN:  Are you having pain?  No pain at present.    Observations; increased darkening and sensitivity of skin in axillary region and incision are of areola.  Skin peeling under armpit but healing,Palpable firmness at areola incision and below with sensitivity. Nipple area very dry  Palpation: very tender sternal and axillary border of pectorals, left UT, lats  UPPER EXTREMITY AROM/PROM:   A/PROM RIGHT  04/06/2021    Shoulder extension 57  Shoulder flexion 156  Shoulder abduction 165  Shoulder internal rotation 65 (FIR bra line)  Shoulder external rotation 105                          (Blank rows = not tested)   A/PROM LEFT  04/06/2021 04/13/21 04/18/21 04/1536/2023 05/19/2021 05/30/2021  Shoulder extension 34   53 46 56  Shoulder flexion 124 120 140 149 144 153  Shoulder abduction 83 122 155 167 127 157  Shoulder internal rotation FIR left gluts   T9    Shoulder external rotation     90 84                           (Blank rows = not tested)  CERVICAL AROM: All within functional limits: Very tight in left UT with SB and rotation   UPPER EXTREMITY STRENGTH: NT due to pain     LYMPHEDEMA ASSESSMENTS:    SURGERY TYPE/DATE: Left lumpectomy for DCIS 02/24/2021   NUMBER OF LYMPH NODES REMOVED: 0   CHEMOTHERAPY: no   RADIATION:yes   HORMONE TREATMENT: yes   INFECTIONS: no     TODAY'S TREATMENT  Pts left breast visualized;no significant redness, but skin is very dry and nipple area is irritated. Discussed with pt talking to a Nurse Navigator so one of the Doctors can check her breast. She indicted that she has a terrible problem with itchiness all over her body and that she has seen a dermatologist who has given her lotions, different soaps etc, and none of it helped. Suggested that she may need to see an allergist but to start with the Cancer center and see  what their recommendations are. No charge for todays visit. Pts shoulder is doing well and she did not want to work on it today.     06/01/2021  Performed left breast MLD on Pt.; supraclavicular, 5 breaths, Activated Bilateral axillary LN's and left inguinal LN's, anterior interaxillary pathway, axillo-inguinal pathway and left breast redirecting to pathways and retracing all steps. Pt reviewed all and performed all steps. She had steps memorized and did not need to look at her instructions. Pt applied a very small amount of cocoa butter to right upper chest area of itching and a small amount to nipple to help itching. Gave pt a small amount of cocoa butter in a cup to try at home before buying. Pt had several small superficial bumps on her left arm and right breast. She was seeing family DR. today about them.   05/30/2021 Reassessed left shoulder ROM with excellent improvement Performed left breast MLD on Pt.; supraclavicular, 5 breaths, Activated Bilateral axillary LN's and left inguinal LN's, anterior interaxillary pathway, axillo-inguinal pathway and left breast redirecting to pathways and retracing all steps. Pt was instructed in same and performed all steps. She was given illustrated and written instructions. Pt applied a very small amount of cocoa butter to right upper chest area of itching and a small amount to lower nipple.  She wanted to try to see if it would help itching, and if so she will purchase some.   05/19/2021 Measured left shoulder area with tightness and decreased ROM in all motions MANUAL: soft tissue mobilization to left pectorals, UT, Lats and in SL left scapular area and UT with cocoa butter . Foam pad made to place in compression bra in area of firmness. THERAPEUTIC EXERCISE AA shoulder flexion and stargazer x 5 Lower trunk rotation bilaterally x 5 with arms outstretched and with goal post arms with towel roll under left UE to decrease ER  Exercises - Seated Upper  Trapezius Stretch  - 1 x daily - 7 x weekly - 1 sets - 5 reps - 5 hold - Neck Rotation Stretch  - 1 x daily - 7 x weekly - 1 sets - 5 reps - 5 hold 4 post op exercises:supine flexion and stargazer, sitting scapular retraction, standing wall slide for abduction Person educated: Patient Education method: Explanation, Demonstration, and Handouts Education comprehension: verbalized understanding     HOME EXERCISE PROGRAM: 4 post op exercises:supine flex and stargazer, Scapular retraction, wall walk abduction, Right SB and rotation neck stretches., supine wand flexion and scaption, Lower trunk rotation with arms outstretched and goal post   ASSESSMENT:  CLINICAL IMPRESSION: No charge for todays visit. She will call the nurse when she gets home to be scheduled to see a Dr. For follow up and recommendations for itching. Will place pt on hold at this time.     OBJECTIVE IMPAIRMENTS increased edema, postural dysfunction, and pain, ROM limitations    ACTIVITY LIMITATIONS  cleaning, and anything requiring reaching, work activities .    PERSONAL FACTORS  mild language barrier( [place]  are also affecting patient's functional outcome.      REHAB POTENTIAL: Good   CLINICAL DECISION MAKING: Stable/uncomplicated   EVALUATION COMPLEXITY: Low   GOALS: Goals reviewed with patient? Yes   LONG TERM GOALS:   Pt will be independent in self breast MLD to decrease swelling Baseline:  Target date: 05/18/2021 Goal status: Ongoing   2.  Pt will have decreased pain and swelling by 50% or more Baseline:  Target date: 05/18/2021 Goal status: Achieved for pain 3.  Pt will have left shoulder flexion and scaption 125 for improved reaching Baseline:  Target date: 05/18/2021 Goal status: Achieved  4.  Pt will demonstrate full left shoulder ROM when compared with the right Baseline: (nearly 05/02/2021) Target date: 05/18/2021 Goal status: ongoing   5.  Quick dash will be no greater than  15% Baseline: 36 Target date: 05/18/2021 Goal status: INITIAL   6.  Pt will have appropriate sports/compression bra to decrease left breast edema Baseline: none Target date: 05/18/2021 Goal status: Achieved   PLAN: PT FREQUENCY: 2x/week   PT DURATION: 4 weeks   PLANNED INTERVENTIONS: Therapeutic exercises, Patient/Family education, Manual lymph drainage, Vasopneumatic device, and Manual therapy   PLAN FOR NEXT SESSION:  DC if no contact,continue MLD and  review  left breast MLD with pt for swelling, STM prn,      PHYSICAL THERAPY DISCHARGE SUMMARY  Visits from Start of Care: 10  Current functional level related to goals / functional outcomes: Pt had partially achieved goals established   Remaining deficits: Pt was experiencing skin irritation for radiation   Education / Equipment: Breast MLD, HEP   Patient agrees to discharge. Patient goals were partially met. Patient is being discharged due to not returning since the last visit.     Claris Pong, PT 06/08/2021, 1:30 PM

## 2021-06-16 ENCOUNTER — Other Ambulatory Visit: Payer: Self-pay | Admitting: Student

## 2021-06-16 DIAGNOSIS — N611 Abscess of the breast and nipple: Secondary | ICD-10-CM

## 2021-06-20 ENCOUNTER — Other Ambulatory Visit: Payer: 59

## 2021-06-22 ENCOUNTER — Other Ambulatory Visit: Payer: 59

## 2021-06-28 ENCOUNTER — Ambulatory Visit
Admission: RE | Admit: 2021-06-28 | Discharge: 2021-06-28 | Disposition: A | Discharge status: Home / Self Care | Payer: 59 | Source: Ambulatory Visit | Attending: Pulmonary Disease | Admitting: Pulmonary Disease

## 2021-06-28 DIAGNOSIS — R0609 Other forms of dyspnea: Secondary | ICD-10-CM

## 2021-06-28 DIAGNOSIS — R9389 Abnormal findings on diagnostic imaging of other specified body structures: Secondary | ICD-10-CM

## 2021-07-04 NOTE — Progress Notes (Signed)
Left upper lobe nodularity- small spot in the lung  No signs of scarring in the lungs  Follow-up CT in 6 months to follow-up on the nodularity in the lung

## 2021-07-09 ENCOUNTER — Telehealth: Payer: Self-pay | Admitting: Pulmonary Disease

## 2021-07-09 DIAGNOSIS — R9389 Abnormal findings on diagnostic imaging of other specified body structures: Secondary | ICD-10-CM

## 2021-07-14 ENCOUNTER — Other Ambulatory Visit: Payer: Self-pay | Admitting: Hematology and Oncology

## 2021-07-19 NOTE — Telephone Encounter (Signed)
She has an OV coming up and the provider will go over the next CT scan.

## 2021-08-01 ENCOUNTER — Telehealth: Payer: Self-pay | Admitting: Adult Health

## 2021-08-01 NOTE — Telephone Encounter (Signed)
Per 7/18 phone line pt called to r/s appointment.  Appointment r/s per pt request.

## 2021-08-07 ENCOUNTER — Other Ambulatory Visit (HOSPITAL_COMMUNITY)
Admission: RE | Admit: 2021-08-07 | Discharge: 2021-08-07 | Disposition: A | Discharge status: Home / Self Care | Payer: 59 | Source: Ambulatory Visit | Attending: Family Medicine | Admitting: Family Medicine

## 2021-08-07 ENCOUNTER — Other Ambulatory Visit: Payer: Self-pay | Admitting: Family Medicine

## 2021-08-07 DIAGNOSIS — Z01411 Encounter for gynecological examination (general) (routine) with abnormal findings: Secondary | ICD-10-CM | POA: Diagnosis present

## 2021-08-08 ENCOUNTER — Ambulatory Visit: Payer: 59

## 2021-08-08 DIAGNOSIS — R0609 Other forms of dyspnea: Secondary | ICD-10-CM

## 2021-08-08 DIAGNOSIS — G4719 Other hypersomnia: Secondary | ICD-10-CM

## 2021-08-14 LAB — CYTOLOGY - PAP
Comment: NEGATIVE
Diagnosis: NEGATIVE
High risk HPV: NEGATIVE

## 2021-08-17 ENCOUNTER — Ambulatory Visit: Payer: 59 | Admitting: Hematology and Oncology

## 2021-08-17 ENCOUNTER — Encounter: Payer: 59 | Admitting: Adult Health

## 2021-08-21 ENCOUNTER — Ambulatory Visit: Payer: BC Managed Care – PPO

## 2021-08-21 DIAGNOSIS — G4733 Obstructive sleep apnea (adult) (pediatric): Secondary | ICD-10-CM

## 2021-08-24 ENCOUNTER — Encounter: Payer: Self-pay | Admitting: Hematology and Oncology

## 2021-08-25 ENCOUNTER — Other Ambulatory Visit: Payer: Self-pay | Admitting: *Deleted

## 2021-08-29 ENCOUNTER — Encounter: Payer: Self-pay | Admitting: *Deleted

## 2021-08-29 ENCOUNTER — Encounter: Payer: Self-pay | Admitting: Hematology and Oncology

## 2021-09-02 ENCOUNTER — Telehealth: Payer: Self-pay | Admitting: Pulmonary Disease

## 2021-09-02 DIAGNOSIS — G4733 Obstructive sleep apnea (adult) (pediatric): Secondary | ICD-10-CM | POA: Diagnosis not present

## 2021-09-02 NOTE — Telephone Encounter (Signed)
Call patient  Sleep study result  Date of study: 08/21/2021  Impression: Moderate obstructive sleep apnea Mild oxygen desaturations  Recommendation: DME referral  Recommend CPAP therapy for moderate obstructive sleep apnea  Auto titrating CPAP with pressure settings of 5-15 will be appropriate  Encourage weight loss measures  Follow-up in the office 4 to 6 weeks following initiation of treatment

## 2021-09-05 NOTE — Telephone Encounter (Signed)
She wants to go over results at the next office visit. Nothing further needed.

## 2021-09-06 ENCOUNTER — Ambulatory Visit: Payer: 59 | Admitting: Pulmonary Disease

## 2021-09-19 ENCOUNTER — Other Ambulatory Visit: Payer: Self-pay

## 2021-09-19 ENCOUNTER — Encounter: Payer: Self-pay | Admitting: Adult Health

## 2021-09-19 ENCOUNTER — Inpatient Hospital Stay: Payer: BC Managed Care – PPO | Attending: Hematology and Oncology | Admitting: Adult Health

## 2021-09-19 VITALS — BP 142/58 | HR 79 | Temp 97.0°F | Resp 18 | Ht 65.0 in | Wt 176.7 lb

## 2021-09-19 DIAGNOSIS — D0512 Intraductal carcinoma in situ of left breast: Secondary | ICD-10-CM | POA: Insufficient documentation

## 2021-09-19 DIAGNOSIS — Z170 Estrogen receptor positive status [ER+]: Secondary | ICD-10-CM | POA: Diagnosis not present

## 2021-09-19 DIAGNOSIS — C50912 Malignant neoplasm of unspecified site of left female breast: Secondary | ICD-10-CM

## 2021-09-19 NOTE — Progress Notes (Signed)
SURVIVORSHIP VISIT:  BRIEF ONCOLOGIC HISTORY:  Oncology History  Malignant neoplasm of left breast in female, estrogen receptor positive ([place])  01/26/2021 Cancer Staging   Staging form: Breast, AJCC 8th Edition - Clinical stage from 01/26/2021: Stage 0 (cTis (DCIS), cN0, cM0, ER+, PR+) - Signed by Benay Pike, MD on 02/27/2021 Stage prefix: Initial diagnosis   02/01/2021 Initial Diagnosis   Malignant neoplasm of left breast in female, estrogen receptor positive ([place])   02/24/2021 Surgery   Left breast lumpectomy Marlou Starks): DCIS 2.4cm intermed to high grade with necrosis and calcifications, margins neg   04/11/2021 - 05/08/2021 Radiation Therapy   Site Technique Total Dose (Gy) Dose per Fx (Gy) Completed Fx Beam Energies  Breast, Left: Breast_L 3D 42.56/42.56 2.66 16/16 6XFFF  Breast, Left: Breast_L_Bst 3D 10/10 2.5 4/4 6X, 10X     07/2021 -  Anti-estrogen oral therapy   Tamoxifen     INTERVAL HISTORY:  Frances Patton to review her survivorship care plan detailing her treatment course for breast cancer, as well as monitoring long-term side effects of that treatment, education regarding health maintenance, screening, and overall wellness and health promotion.     Overall, Frances Patton reports feeling moderately well.  She has been feeling worse since starting Tamoxifen.  She is having hot flashes, worse aches and pains and is concerned about some sweating and a couple of hair follicles that are inflamed.    REVIEW OF SYSTEMS:  Review of Systems  Constitutional:  Positive for fatigue. Negative for appetite change, chills, fever and unexpected weight change.  HENT:   Negative for hearing loss, lump/mass and trouble swallowing.   Eyes:  Negative for eye problems and icterus.  Respiratory:  Negative for chest tightness, cough and shortness of breath.   Cardiovascular:  Negative for chest pain, leg swelling and palpitations.  Gastrointestinal:  Negative for abdominal distention, abdominal pain,  constipation, diarrhea, nausea and vomiting.  Endocrine: Positive for hot flashes.  Genitourinary:  Negative for difficulty urinating.   Musculoskeletal:  Negative for arthralgias.  Skin:  Negative for itching and rash.  Neurological:  Negative for dizziness, extremity weakness, headaches and numbness.  Hematological:  Negative for adenopathy. Does not bruise/bleed easily.  Psychiatric/Behavioral:  Negative for depression. The patient is not nervous/anxious.   Breast: Denies any new nodularity, masses, tenderness, nipple changes, or nipple discharge.    ONCOLOGY TREATMENT TEAM:  1. Surgeon:  Dr. Marlou Starks at [facility] 2. Medical Oncologist: Dr. Chryl Heck  3. Radiation Oncologist: Dr. Lisbeth Renshaw    PAST MEDICAL/SURGICAL HISTORY:  Past Medical History:  Diagnosis Date   Cancer ([place]) 02/03/2021   Left breast DCIS   GERD (gastroesophageal reflux disease)    Headache    PCOS (polycystic ovarian syndrome)    Past Surgical History:  Procedure Laterality Date   BREAST BIOPSY Left 01/26/2021   stereo biopsy/ x clip/ path pending   BREAST LUMPECTOMY WITH RADIOACTIVE SEED LOCALIZATION Left 02/24/2021   Procedure: LEFT BREAST LUMPECTOMY WITH RADIOACTIVE SEED LOCALIZATION;  Surgeon: Jovita Kussmaul, MD;  Location: [facility];  Service: General;  Laterality: Left;   COLONOSCOPY       ALLERGIES:  Allergies  Allergen Reactions   Latex Itching     CURRENT MEDICATIONS:  Outpatient Encounter Medications as of 09/19/2021  Medication Sig   b complex vitamins capsule Take by mouth daily.   ibuprofen (ADVIL) 200 MG tablet Take by mouth.   magnesium 30 MG tablet Take by mouth 2 (two) times daily.  omeprazole (PRILOSEC) 10 MG capsule Take by mouth.   pyridOXINE (VITAMIN B-6) 100 MG tablet Take 100 mg by mouth daily.   tamoxifen (NOLVADEX) 20 MG tablet TAKE 1 TABLET BY MOUTH EVERY DAY   VITAMIN D PO Take by mouth.   No facility-administered encounter medications on file as of  09/19/2021.     ONCOLOGIC FAMILY HISTORY:  Family History  Problem Relation Age of Onset   Breast cancer Mother    Prostate cancer Father     SOCIAL HISTORY:  Social History   Socioeconomic History   Marital status: Married    Spouse name: Not on file   Number of children: Not on file   Years of education: Not on file   Highest education level: Not on file  Occupational History   Not on file  Tobacco Use   Smoking status: Never   Smokeless tobacco: Never  Vaping Use   Vaping Use: Never used  Substance and Sexual Activity   Alcohol use: Never   Drug use: Never   Sexual activity: Not on file  Other Topics Concern   Not on file  Social History Narrative   Not on file   Social Determinants of Health   Financial Resource Strain: Not on file  Food Insecurity: Not on file  Transportation Needs: Not on file  Physical Activity: Not on file  Stress: Not on file  Social Connections: Not on file  Intimate Partner Violence: Not on file     OBSERVATIONS/OBJECTIVE:  BP (!) 142/58 (BP Location: Left Arm, Patient Position: Sitting, Cuff Size: Large)   Pulse 79   Temp (!) 97 F (36.1 C) (Temporal)   Resp 18   Ht '5\' 5"'  (1.651 m)   Wt 176 lb 11.2 oz (80.2 kg)   SpO2 99%   BMI 29.40 kg/m  GENERAL: Patient is a well appearing female in no acute distress HEENT:  Sclerae anicteric.  Oropharynx clear and moist. No ulcerations or evidence of oropharyngeal candidiasis. Neck is supple.  NODES:  No cervical, supraclavicular, or axillary lymphadenopathy palpated.  BREAST EXAM:  left breast s/p lumpectomy and radiation--no sign of local recurrence, right breast is benign LUNGS:  Clear to auscultation bilaterally.  No wheezes or rhonchi. HEART:  Regular rate and rhythm. No murmur appreciated. ABDOMEN:  Soft, nontender.  Positive, normoactive bowel sounds. No organomegaly palpated. MSK:  No focal spinal tenderness to palpation. Full range of motion bilaterally in the upper  extremities. EXTREMITIES:  No peripheral edema.   SKIN:  Clear with no obvious rashes or skin changes. No nail dyscrasia. NEURO:  Nonfocal. Well oriented.  Appropriate affect.   LABORATORY DATA:  None for this visit.  DIAGNOSTIC IMAGING:  None for this visit.   ASSESSMENT AND PLAN:  Ms.. Patton is a pleasant 56 y.o. female with Stage 0 left breast DCIS, ER+/PR+/HER2-, diagnosed in 01/2021, treated with lumpectomy, adjuvant radiation therapy, and anti-estrogen therapy with Tamoxifen beginning in 07/2021.  She presents to the Survivorship Clinic for our initial meeting and routine follow-up post-completion of treatment for breast cancer.    1. Stage 0 left breast cancer:  Frances Patton is continuing to recover from definitive treatment for breast cancer. She will follow-up with her medical oncologist, Dr. Chryl Heck in 6 months with history and physical exam per surveillance protocol.  She is struggling with taking Tamoxifen daily and I recommended that she take a one week break from the therapy and restart and 1/2 tab daily.    Her mammogram  is due 11/2021; orders placed today.  She does not want to have this any earlier to November.  Today, a comprehensive survivorship care plan and treatment summary was reviewed with the patient today detailing her breast cancer diagnosis, treatment course, potential late/long-term effects of treatment, appropriate follow-up care with recommendations for the future, and patient education resources.  A copy of this summary, along with a letter will be sent to the patient's primary care provider via mail/fax/In Basket message after today's visit.    2. Bone health:  She was given education on specific activities to promote bone health.  3. Cancer screening:  Due to Frances Patton history and her age, she should receive screening for skin cancers, colon cancer, and gynecologic cancers.  The information and recommendations are listed on the patient's comprehensive care  plan/treatment summary and were reviewed in detail with the patient.    4. Health maintenance and wellness promotion: Frances Patton was encouraged to consume 5-7 servings of fruits and vegetables per day. We reviewed the "Nutrition Rainbow" handout.  She was also encouraged to engage in moderate to vigorous exercise for 30 minutes per day most days of the week. We discussed the [employer] fitness program, which is designed for cancer survivors to help them become more physically fit after cancer treatments.  She was instructed to limit her alcohol consumption and continue to abstain from tobacco use.     5. Support services/counseling: It is not uncommon for this period of the patient's cancer care trajectory to be one of many emotions and stressors.  We discussed how this can be increasingly difficult during the times of quarantine and social distancing due to the COVID-19 pandemic.   She was given information regarding our available services and encouraged to contact me with any questions or for help enrolling in any of our support group/programs.    Follow up instructions:    -Return to cancer center in 4 weeks to f/u with Dr. Chryl Heck  -Mammogram due in 11/2021 -She is welcome to return back to the Survivorship Clinic at any time; no additional follow-up needed at this time.  -Consider referral back to survivorship as a long-term survivor for continued surveillance  The patient was provided an opportunity to ask questions and all were answered. The patient agreed with the plan and demonstrated an understanding of the instructions.   Total encounter time:40 minutes*in face-to-face visit time, chart review, lab review, care coordination, order entry, and documentation of the encounter time.  Wilber Bihari, NP 09/19/21 8:14 PM Medical Oncology and Hematology [facility] [place], [place] [address] Tel. [phone]    Fax. [phone]  *Total Encounter Time as  defined by the Centers for Medicare and Medicaid Services includes, in addition to the face-to-face time of a patient visit (documented in the note above) non-face-to-face time: obtaining and reviewing outside history, ordering and reviewing medications, tests or procedures, care coordination (communications with other health care professionals or caregivers) and documentation in the medical record.

## 2021-10-05 DIAGNOSIS — G43009 Migraine without aura, not intractable, without status migrainosus: Secondary | ICD-10-CM | POA: Diagnosis not present

## 2021-10-05 DIAGNOSIS — H9312 Tinnitus, left ear: Secondary | ICD-10-CM | POA: Diagnosis not present

## 2021-10-05 DIAGNOSIS — H903 Sensorineural hearing loss, bilateral: Secondary | ICD-10-CM | POA: Diagnosis not present

## 2021-10-16 ENCOUNTER — Ambulatory Visit (INDEPENDENT_AMBULATORY_CARE_PROVIDER_SITE_OTHER): Payer: BC Managed Care – PPO | Admitting: Pulmonary Disease

## 2021-10-16 ENCOUNTER — Encounter: Payer: Self-pay | Admitting: Pulmonary Disease

## 2021-10-16 VITALS — BP 126/78 | HR 84 | Temp 98.0°F | Ht 65.0 in | Wt 178.0 lb

## 2021-10-16 DIAGNOSIS — R9389 Abnormal findings on diagnostic imaging of other specified body structures: Secondary | ICD-10-CM | POA: Diagnosis not present

## 2021-10-16 DIAGNOSIS — R918 Other nonspecific abnormal finding of lung field: Secondary | ICD-10-CM | POA: Diagnosis not present

## 2021-10-16 DIAGNOSIS — G4733 Obstructive sleep apnea (adult) (pediatric): Secondary | ICD-10-CM

## 2021-10-16 DIAGNOSIS — R911 Solitary pulmonary nodule: Secondary | ICD-10-CM | POA: Diagnosis not present

## 2021-10-16 DIAGNOSIS — R0609 Other forms of dyspnea: Secondary | ICD-10-CM | POA: Diagnosis not present

## 2021-10-16 LAB — PULMONARY FUNCTION TEST
DL/VA % pred: 129 %
DL/VA: 5.42 ml/min/mmHg/L
DLCO cor % pred: 92 %
DLCO cor: 19.57 ml/min/mmHg
DLCO unc % pred: 92 %
DLCO unc: 19.57 ml/min/mmHg
FEF 25-75 Post: 3.53 L/sec
FEF 25-75 Pre: 2.87 L/sec
FEF2575-%Change-Post: 23 %
FEF2575-%Pred-Post: 141 %
FEF2575-%Pred-Pre: 115 %
FEV1-%Change-Post: 7 %
FEV1-%Pred-Post: 77 %
FEV1-%Pred-Pre: 72 %
FEV1-Post: 2.1 L
FEV1-Pre: 1.95 L
FEV1FVC-%Change-Post: 1 %
FEV1FVC-%Pred-Pre: 112 %
FEV6-%Change-Post: 5 %
FEV6-%Pred-Post: 69 %
FEV6-%Pred-Pre: 65 %
FEV6-Post: 2.33 L
FEV6-Pre: 2.2 L
FEV6FVC-%Pred-Post: 103 %
FEV6FVC-%Pred-Pre: 103 %
FVC-%Change-Post: 5 %
FVC-%Pred-Post: 66 %
FVC-%Pred-Pre: 63 %
FVC-Post: 2.33 L
FVC-Pre: 2.2 L
Post FEV1/FVC ratio: 90 %
Post FEV6/FVC ratio: 100 %
Pre FEV1/FVC ratio: 89 %
Pre FEV6/FVC Ratio: 100 %
RV % pred: 92 %
RV: 1.86 L
TLC % pred: 78 %
TLC: 4.1 L

## 2021-10-16 NOTE — Progress Notes (Signed)
Frances Patton    [id number]    Dec 25, 1965  Primary Care Physician:Timberlake, Anastasia Pall, MD  Referring Physician: Glenis Smoker, MD [address] [place],  [place] [address]  Chief complaint:   Patient is being seen for abnormal CT scan of the chest showing groundglass changes at the bases of the lungs Shortness of breath on exertion  HPI:  She had a CT scan for abdominal discomfort showing bibasal groundglass changes, small lung nodule left lung  No prior history of lung disease  Recent diagnosis of breast cancer for which she received radiation treatments, post surgery.  She does have some chest discomfort which is felt to be related to recent surgery  She does admit to shortness of breath on exertion No chronic cough  Recently had a sleep study showing moderate obstructive sleep apnea with mild oxygen desaturations -Treatment options discussed -We will initiate CPAP therapy  Never smoker No pertinent occupational history/predisposition to lung disease  She did have asthma growing up  Admits to snoring, no witnessed apneas, occasional choking episodes She does have daytime headaches sometimes unrelated to waking up in the morning Usually goes to bed between 11 and 12 Wakes up about 7 to 9 AM Admits to dryness of her throat in the mornings  She denies a chronic cough Denies any musculoskeletal pain apart from that related to recent surgery No arthritic pains No skin rash  She does have occasional reflux  Outpatient Encounter Medications as of 10/16/2021  Medication Sig   b complex vitamins capsule Take by mouth daily.   ibuprofen (ADVIL) 200 MG tablet Take by mouth.   magnesium 30 MG tablet Take by mouth 2 (two) times daily.   omeprazole (PRILOSEC) 10 MG capsule Take by mouth.   pyridOXINE (VITAMIN B-6) 100 MG tablet Take 100 mg by mouth daily.   tamoxifen (NOLVADEX) 20 MG tablet TAKE 1 TABLET BY MOUTH EVERY DAY   VITAMIN D PO Take by  mouth.   No facility-administered encounter medications on file as of 10/16/2021.    Allergies as of 10/16/2021 - Review Complete 10/16/2021  Allergen Reaction Noted   Latex Itching 02/15/2021    Past Medical History:  Diagnosis Date   Cancer ([place]) 02/03/2021   Left breast DCIS   GERD (gastroesophageal reflux disease)    Headache    PCOS (polycystic ovarian syndrome)     Past Surgical History:  Procedure Laterality Date   BREAST BIOPSY Left 01/26/2021   stereo biopsy/ x clip/ path pending   BREAST LUMPECTOMY WITH RADIOACTIVE SEED LOCALIZATION Left 02/24/2021   Procedure: LEFT BREAST LUMPECTOMY WITH RADIOACTIVE SEED LOCALIZATION;  Surgeon: Jovita Kussmaul, MD;  Location: [facility];  Service: General;  Laterality: Left;   COLONOSCOPY      Family History  Problem Relation Age of Onset   Breast cancer Mother    Prostate cancer Father     Social History   Socioeconomic History   Marital status: Married    Spouse name: Not on file   Number of children: Not on file   Years of education: Not on file   Highest education level: Not on file  Occupational History   Not on file  Tobacco Use   Smoking status: Never   Smokeless tobacco: Never  Vaping Use   Vaping Use: Never used  Substance and Sexual Activity   Alcohol use: Never   Drug use: Never   Sexual activity: Not  on file  Other Topics Concern   Not on file  Social History Narrative   Not on file   Social Determinants of Health   Financial Resource Strain: Not on file  Food Insecurity: Not on file  Transportation Needs: Not on file  Physical Activity: Not on file  Stress: Not on file  Social Connections: Not on file  Intimate Partner Violence: Not on file    Review of Systems  Constitutional:  Negative for fatigue.  Respiratory:  Positive for shortness of breath.   Psychiatric/Behavioral:  Positive for sleep disturbance.     Vitals:   10/16/21 1610  BP: 126/78  Pulse: 84  Temp: 98 F  (36.7 C)  SpO2: 100%     Physical Exam Constitutional:      Appearance: Normal appearance.  HENT:     Head: Normocephalic.     Mouth/Throat:     Mouth: Mucous membranes are moist.  Cardiovascular:     Rate and Rhythm: Normal rate and regular rhythm.     Heart sounds: No murmur heard.    No friction rub.  Pulmonary:     Effort: No respiratory distress.     Breath sounds: No stridor. No wheezing or rhonchi.  Musculoskeletal:     Cervical back: No rigidity.  Neurological:     Mental Status: She is alert.  Psychiatric:        Mood and Affect: Mood normal.      Data Reviewed: CT scan of the abdomen was reviewed with the patient showing the bibasal groundglass changes No other previous CT scans to compare this with  CT reviewed with the patient today with mild groundglass changes, left midlung nodule  Sleep study did reveal moderate obstructive sleep apnea with AHI of about 22, mild oxygen desaturations  PFT today shows no significant obstructive disease with no significant bronchodilator response, mild restriction with normal diffusing capacity  Assessment:  Dyspnea on exertion -Multifactorial  Moderate obstructive sleep apnea -CPAP therapy will be appropriate -Auto CPAP 5-15  Abnormal CT showing bibasal groundglass changes, small lung nodule -May be related to air trapping -Follow-up CT scan ordered   Plan/Recommendations: Follow-up in about 3 months  DME referral for auto CPAP 5-15  Obtain full lung CT to assess for interstitial changes  Tentative follow-up in about 3 months  Encouraged to call with significant concerns   Sherrilyn Rist MD [facility] 10/16/2021, 4:14 PM  CC: Glenis Smoker, *

## 2021-10-16 NOTE — Patient Instructions (Signed)
Moderate obstructive sleep apnea -DME referral for CPAP Auto CPAP 5-15  Follow-up CT scan for lung nodule  Tentative follow-up in about 3 months  Call us with significant concerns

## 2021-10-16 NOTE — Patient Instructions (Signed)
Full PFT performed today. °

## 2021-10-16 NOTE — Progress Notes (Signed)
Full PFT performed today. °

## 2021-10-17 ENCOUNTER — Inpatient Hospital Stay: Payer: BC Managed Care – PPO | Attending: Hematology and Oncology | Admitting: Hematology and Oncology

## 2021-10-17 ENCOUNTER — Other Ambulatory Visit: Payer: Self-pay

## 2021-10-17 ENCOUNTER — Encounter: Payer: Self-pay | Admitting: Hematology and Oncology

## 2021-10-17 DIAGNOSIS — Z803 Family history of malignant neoplasm of breast: Secondary | ICD-10-CM | POA: Insufficient documentation

## 2021-10-17 DIAGNOSIS — C50912 Malignant neoplasm of unspecified site of left female breast: Secondary | ICD-10-CM

## 2021-10-17 DIAGNOSIS — Z79810 Long term (current) use of selective estrogen receptor modulators (SERMs): Secondary | ICD-10-CM | POA: Diagnosis not present

## 2021-10-17 DIAGNOSIS — D0512 Intraductal carcinoma in situ of left breast: Secondary | ICD-10-CM | POA: Insufficient documentation

## 2021-10-17 DIAGNOSIS — Z170 Estrogen receptor positive status [ER+]: Secondary | ICD-10-CM

## 2021-10-17 NOTE — Assessment & Plan Note (Signed)
This is a very pleasant 56 year old female patient with DCIS now on tamoxifen for adjuvant antiestrogen therapy who is here for some concerns regarding skin nodules.  Patient tells me that she has noticed some skin nodules hence she was asked to stop tamoxifen and restarted at half the dose.  Since she started half the dose, she has been feeling more emotional lability.  We have examined the skin nodules.  This is unlikely related to tamoxifen.  I have reassured her that DCIS is stage 0 breast cancer and cannot in the area metastasized to the skin nodules.  With regards to tamoxifen dosing, I have explained to her that low-dose tamoxifen has been studied in DCIS and has been safe and effective.  However if she feels comfortable on full dose, she can resume 20 mg dosing and return to clinic in 6 months. She is very anxious at baseline, needs a lot of reassurance.  Her left breast changes are likely postop and postradiation changes.  I have asked her to schedule a mammogram in January.  Return to clinic in 6 months or sooner as needed.

## 2021-10-17 NOTE — Progress Notes (Signed)
[facility] NOTE  Patient Care Team: Glenis Smoker, MD as PCP - General (Family Medicine) Jovita Kussmaul, MD as Consulting Physician (General Surgery) Benay Pike, MD as Consulting Physician (Hematology and Oncology) Kyung Rudd, MD as Consulting Physician (Radiation Oncology)  CHIEF COMPLAINTS/PURPOSE OF CONSULTATION:  DCIS  ASSESSMENT & PLAN:   Malignant neoplasm of left breast in female, estrogen receptor positive [facility]) This is a very pleasant 56 year old female patient with DCIS now on tamoxifen for adjuvant antiestrogen therapy who is here for some concerns regarding skin nodules.  Patient tells me that she has noticed some skin nodules hence she was asked to stop tamoxifen and restarted at half the dose.  Since she started half the dose, she has been feeling more emotional lability.  We have examined the skin nodules.  This is unlikely related to tamoxifen.  I have reassured her that DCIS is stage 0 breast cancer and cannot in the area metastasized to the skin nodules.  With regards to tamoxifen dosing, I have explained to her that low-dose tamoxifen has been studied in DCIS and has been safe and effective.  However if she feels comfortable on full dose, she can resume 20 mg dosing and return to clinic in 6 months. She is very anxious at baseline, needs a lot of reassurance.  Her left breast changes are likely postop and postradiation changes.  I have asked her to schedule a mammogram in January.  Return to clinic in 6 months or sooner as needed.   No orders of the defined types were placed in this encounter. HISTORY OF PRESENTING ILLNESS:   Frances Patton 56 y.o. 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 01/18/2021 diagnostic mammogram of the left breast showed  suspicious area of distortion with calcifications in the lateral left breast, no evidence of left axillary lymphadenopathy. Biopsy from the left breast mass showed ductal carcinoma in situ, nuclear grade 2, cribriform/comedo/solid patterns with central necrosis and calcifications, longest involved segment 4 mm in greatest length, ER +100%, strong staining intensity, PR +30%, strong staining intensity She underwent left breast lumpectomy, pathology shows intermediate to high-grade DCIS with necrosis and calcifications measuring approximately 2.4 cm, resection margins negative for DCIS no evidence of invasive carcinoma.  Prior prognostics showed ER/PR positive tumor She is s/p adjuvant radiation.  Interval history She is here for follow-up on tamoxifen.  She tells me that she has some skin nodules and she was worried that this is related to her DCIS.  She tells me that she when she called with a skin rash, someone recommended to hold her tamoxifen for a week and restarted half the dose.  Since she started the half dose of tamoxifen, she has felt more emotional anguish and she was wondering if this is because she had a small break in her tamoxifen.  She is very worried about the postop changes in the intermittent left breast pain that she is noticing. Rest of the pertinent 10 point ROS reviewed and negative  MEDICAL HISTORY:  Past Medical History:  Diagnosis Date   Cancer ([facility]) 02/03/2021   Left breast DCIS   GERD (gastroesophageal reflux disease)    Headache    PCOS (polycystic ovarian syndrome)     SURGICAL HISTORY: Past Surgical History:  Procedure Laterality Date   BREAST BIOPSY Left 01/26/2021  stereo biopsy/ x clip/ path pending   BREAST LUMPECTOMY WITH RADIOACTIVE SEED LOCALIZATION Left 02/24/2021   Procedure: LEFT BREAST LUMPECTOMY WITH RADIOACTIVE SEED LOCALIZATION;  Surgeon: Jovita Kussmaul, MD;  Location: [facility];  Service: General;  Laterality: Left;   COLONOSCOPY       SOCIAL HISTORY: Social History   Socioeconomic History   Marital status: Married    Spouse name: Not on file   Number of children: Not on file   Years of education: Not on file   Highest education level: Not on file  Occupational History   Not on file  Tobacco Use   Smoking status: Never   Smokeless tobacco: Never  Vaping Use   Vaping Use: Never used  Substance and Sexual Activity   Alcohol use: Never   Drug use: Never   Sexual activity: Not on file  Other Topics Concern   Not on file  Social History Narrative   Not on file   Social Determinants of Health   Financial Resource Strain: Not on file  Food Insecurity: Not on file  Transportation Needs: Not on file  Physical Activity: Not on file  Stress: Not on file  Social Connections: Not on file  Intimate Partner Violence: Not on file    FAMILY HISTORY: Family History  Problem Relation Age of Onset   Breast cancer Mother    Prostate cancer Father    Mom had it in 60/70 Dad died from metastatic prostate cancer at 14.  ALLERGIES:  is allergic to latex.  MEDICATIONS:  Current Outpatient Medications  Medication Sig Dispense Refill   b complex vitamins capsule Take by mouth daily.     ibuprofen (ADVIL) 200 MG tablet Take by mouth.     magnesium 30 MG tablet Take by mouth 2 (two) times daily.     omeprazole (PRILOSEC) 10 MG capsule Take by mouth.     pyridOXINE (VITAMIN B-6) 100 MG tablet Take 100 mg by mouth daily.     tamoxifen (NOLVADEX) 20 MG tablet TAKE 1 TABLET BY MOUTH EVERY DAY 90 tablet 1   VITAMIN D PO Take by mouth.     No current facility-administered medications for this visit.     PHYSICAL EXAMINATION: ECOG PERFORMANCE STATUS: 0 - Asymptomatic  Vitals:   10/17/21 1445  BP: (!) 150/59  Pulse: 79  Resp: 16  Temp: 98.2 F (36.8 C)  SpO2: 98%   Filed Weights   10/17/21 1445  Weight: 177 lb 9.6 oz (80.6 kg)   Physical Exam Constitutional:      Appearance: Normal appearance.   Chest:     Comments: Left exam with postop changes and small postop seroma.  No other concerns. Skin:    Comments: Small very superficial skin nodules noted on the forearm and random sites, this is likely unrelated to tamoxifen or the DCIS.  Neurological:     Mental Status: She is alert.   At 444 LABORATORY DATA:  I have reviewed the data as listed No results found for: "WBC", "HGB", "HCT", "MCV", "PLT"   Chemistry   No results found for: "NA", "K", "CL", "CO2", "BUN", "CREATININE", "GLU" No results found for: "CALCIUM", "ALKPHOS", "AST", "ALT", "BILITOT"     RADIOGRAPHIC STUDIES: I have personally reviewed the radiological images as listed and agreed with the findings in the report. No results found.  All questions were answered. The patient knows to call the clinic with any problems, questions or concerns. I spent 30 minutes in  the care of this patient including H and P, review of records, counseling and coordination of care.     Benay Pike, MD 10/17/2021 4:43 PM

## 2021-12-22 ENCOUNTER — Other Ambulatory Visit: Payer: BC Managed Care – PPO

## 2021-12-26 ENCOUNTER — Ambulatory Visit
Admission: RE | Admit: 2021-12-26 | Discharge: 2021-12-26 | Disposition: A | Discharge status: Home / Self Care | Payer: BC Managed Care – PPO | Source: Ambulatory Visit | Attending: Pulmonary Disease | Admitting: Pulmonary Disease

## 2021-12-26 DIAGNOSIS — R9389 Abnormal findings on diagnostic imaging of other specified body structures: Secondary | ICD-10-CM

## 2021-12-26 DIAGNOSIS — R911 Solitary pulmonary nodule: Secondary | ICD-10-CM | POA: Diagnosis not present

## 2022-01-07 ENCOUNTER — Other Ambulatory Visit: Payer: Self-pay | Admitting: Hematology and Oncology

## 2022-02-14 ENCOUNTER — Telehealth: Payer: Self-pay | Admitting: Pulmonary Disease

## 2022-02-14 DIAGNOSIS — R9389 Abnormal findings on diagnostic imaging of other specified body structures: Secondary | ICD-10-CM

## 2022-02-14 NOTE — Telephone Encounter (Signed)
PT calling for FU appt and I noted we need another CT scan BEFORE appt w/Dr. Ander Slade. PT States she can not afford another CT scan. Should we still make a FU without CT. Also, PT said she feels her issues have cleared up. Does she really need appt. No job currently. [phone]

## 2022-02-15 NOTE — Telephone Encounter (Signed)
Called and spoke to patient and she is wanting to know her results of her CT scan that she had in December. She is also asking if she still needs to follow up because she feels like she has no issues. And she does not have a job at the moment.   Please advise sir

## 2022-02-15 NOTE — Telephone Encounter (Signed)
There is haziness in the lung where the previous spot was  This is likely related to either infection or inflammation, the expectation is something like this settles after a few weeks  There is no way to be sure whether the spot is still there or the spot has involved without getting another CAT scan  Recommendation will be a CT scan after about 3 months from the last one to allow time for resolution of the haziness

## 2022-02-16 NOTE — Telephone Encounter (Signed)
Called and spoke with pt letting her know the info per AO and she verbalized understanding. Order placed for repeat CT. Nothing further needed.

## 2022-03-28 ENCOUNTER — Telehealth: Payer: Self-pay | Admitting: Hematology and Oncology

## 2022-03-28 NOTE — Telephone Encounter (Signed)
Reached out to reschedule patient per [place] being out of office, patient aware of time and date change.

## 2022-04-18 ENCOUNTER — Ambulatory Visit: Payer: BC Managed Care – PPO | Admitting: Hematology and Oncology

## 2022-04-25 ENCOUNTER — Other Ambulatory Visit: Payer: Self-pay

## 2022-04-25 ENCOUNTER — Inpatient Hospital Stay: Payer: BC Managed Care – PPO | Attending: Hematology and Oncology | Admitting: Hematology and Oncology

## 2022-04-25 VITALS — BP 158/48 | HR 87 | Temp 98.1°F | Resp 18 | Ht 65.0 in | Wt 183.1 lb

## 2022-04-25 DIAGNOSIS — Z79810 Long term (current) use of selective estrogen receptor modulators (SERMs): Secondary | ICD-10-CM | POA: Insufficient documentation

## 2022-04-25 DIAGNOSIS — C50912 Malignant neoplasm of unspecified site of left female breast: Secondary | ICD-10-CM

## 2022-04-25 DIAGNOSIS — Z803 Family history of malignant neoplasm of breast: Secondary | ICD-10-CM | POA: Insufficient documentation

## 2022-04-25 DIAGNOSIS — D0512 Intraductal carcinoma in situ of left breast: Secondary | ICD-10-CM | POA: Diagnosis not present

## 2022-04-25 NOTE — Progress Notes (Signed)
[facility] CONSULT NOTE  Patient Care Team: Shon Haleimberlake, Kathryn S, MD as PCP - General (Family Medicine) Griselda Mineroth, Paul III, MD as Consulting Physician (General Surgery) Rachel MouldsIruku, Travell Desaulniers, MD as Consulting Physician (Hematology and Oncology) Dorothy PufferMoody, John, MD as Consulting Physician (Radiation Oncology)  CHIEF COMPLAINTS/PURPOSE OF CONSULTATION:  DCIS  ASSESSMENT & PLAN:   Malignant neoplasm of left breast in female, estrogen receptor positive [facility](H[facility]) This is a very pleasant 57 year old female patient with DCIS now on tamoxifen for adjuvant antiestrogen therapy who is here for follow-up.  Since her last visit here, she continues to report pain in the left breast especially in the inframammary region and she says she wont be able to take a mammogram.  She requests other forms of breast cancer screening hence we have discussed about MRI and this has been ordered.  She asked for an ultrasound but we have recommended that ultrasound alone is not a good screening method.  With regards to the tenderness, this is likely posttreatment changes, no palpable masses.  She has no major adverse effects from tamoxifen, she does have some weight gain of about 20 pounds but she states she has been eating twice the amount.  I have encouraged her to cut down on portion intake as well as to start becoming more active regularly.  She expressed understanding.  No other major concerns today.  She will come back to see us in about 6 months or sooner as needed.   Orders Placed This Encounter  Procedures   MR BREAST BILATERAL W WO CONTRAST INC CAD    Standing Status:   Future    Standing Expiration Date:   04/25/2023    Order Specific Question:   If indicated for the ordered procedure, I authorize the administration of contrast media per Radiology protocol    Answer:   Yes    Order Specific Question:   What is the patient's sedation requirement?    Answer:   No Sedation    Order Specific Question:   Does the  patient have a pacemaker or implanted devices?    Answer:   No    Order Specific Question:   Preferred imaging [facility]?    [facility] (table limit - 550 lbs)  HISTORY OF PRESENTING ILLNESS:   Frances Patton 57 y.o. 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 01/18/2021 diagnostic mammogram of the left breast showed suspicious area of distortion with calcifications in the lateral left breast, no evidence of left axillary lymphadenopathy. Biopsy from the left breast mass showed ductal carcinoma in situ, nuclear grade 2, cribriform/comedo/solid patterns with central necrosis and calcifications, longest involved segment 4 mm in greatest length, ER +100%, strong staining intensity, PR +30%, strong staining intensity She underwent left breast lumpectomy, pathology shows intermediate to high-grade DCIS with necrosis and calcifications measuring approximately 2.4 cm, resection margins negative for DCIS no evidence of invasive carcinoma.  Prior prognostics showed ER/PR positive tumor She is s/p adjuvant radiation.  Interval history She is here for follow-up on tamoxifen. She says her left breast is still sore, can't go for a mammogram. She is taking tamoxifen as instructed.  She states she has gained about 20 pounds in the past 1 year.  She has started cutting down food intake and has been trying to do more exercise.  She still feels that the inframammary fold in her left breast is very sore and she is not sure if she can handle a mammogram.  She otherwise has not noticed any lumps.  She denies any lower extremity swelling, may have noted that she is a little bit more out of breath but she attributes this to weight gain.  No vaginal bleeding. Rest of the pertinent 10 point ROS reviewed and negative  Wt  Readings from Last 3 Encounters: 04/25/22 183 lb 1.6 oz (83.1 kg) 10/17/21 177 lb 9.6 oz (80.6 kg) 10/16/21 178 lb (80.7 kg)    MEDICAL HISTORY:  Past Medical History:  Diagnosis Date   Cancer ([facility]) 02/03/2021   Left breast DCIS   GERD (gastroesophageal reflux disease)    Headache    PCOS (polycystic ovarian syndrome)     SURGICAL HISTORY: Past Surgical History:  Procedure Laterality Date   BREAST BIOPSY Left 01/26/2021   stereo biopsy/ x clip/ path pending   BREAST LUMPECTOMY WITH RADIOACTIVE SEED LOCALIZATION Left 02/24/2021   Procedure: LEFT BREAST LUMPECTOMY WITH RADIOACTIVE SEED LOCALIZATION;  Surgeon: Griselda Miner, MD;  Location: [facility];  Service: General;  Laterality: Left;   COLONOSCOPY      SOCIAL HISTORY: Social History   Socioeconomic History   Marital status: Married    Spouse name: Not on file   Number of children: Not on file   Years of education: Not on file   Highest education level: Not on file  Occupational History   Not on file  Tobacco Use   Smoking status: Never   Smokeless tobacco: Never  Vaping Use   Vaping Use: Never used  Substance and Sexual Activity   Alcohol use: Never   Drug use: Never   Sexual activity: Not on file  Other Topics Concern   Not on file  Social History Narrative   Not on file   Social Determinants of Health   Financial Resource Strain: Not on file  Food Insecurity: Not on file  Transportation Needs: Not on file  Physical Activity: Not on file  Stress: Not on file  Social Connections: Not on file  Intimate Partner Violence: Not on file    FAMILY HISTORY: Family History  Problem Relation Age of Onset   Breast cancer Mother    Prostate cancer Father    Mom had it in 60/70 Dad died from metastatic prostate cancer at 33.  ALLERGIES:  is allergic to latex.  MEDICATIONS:  Current Outpatient Medications  Medication Sig Dispense Refill   b complex vitamins capsule Take by mouth daily.      ibuprofen (ADVIL) 200 MG tablet Take by mouth.     magnesium 30 MG tablet Take by mouth 2 (two) times daily.     omeprazole (PRILOSEC) 10 MG capsule Take by mouth.     pyridOXINE (VITAMIN B-6) 100 MG tablet Take 100 mg by mouth daily.     tamoxifen (NOLVADEX) 20 MG tablet TAKE 1 TABLET BY MOUTH EVERY DAY 90 tablet 1   VITAMIN D PO Take by mouth.     No current facility-administered medications for this visit.     PHYSICAL EXAMINATION: ECOG PERFORMANCE STATUS: 0 - Asymptomatic  Vitals:   04/25/22 1511  BP: (!) 158/48  Pulse: 87  Resp: 18  Temp: 98.1 F (36.7 C)  SpO2: 98%   Filed Weights   04/25/22 1511  Weight: 183 lb 1.6 oz (83.1 kg)   Physical Exam Constitutional:  Appearance: Normal appearance.  Chest:     Comments: Left breast with postop changes.  Tenderness in the inframammary fold but no mass that can correlate.  No other breast changes.  No regional adenopathy. Neurological:     Mental Status: She is alert.   At 444 LABORATORY DATA:  I have reviewed the data as listed No results found for: "WBC", "HGB", "HCT", "MCV", "PLT"   Chemistry   No results found for: "NA", "K", "CL", "CO2", "BUN", "CREATININE", "GLU" No results found for: "CALCIUM", "ALKPHOS", "AST", "ALT", "BILITOT"     RADIOGRAPHIC STUDIES: I have personally reviewed the radiological images as listed and agreed with the findings in the report. No results found.  All questions were answered. The patient knows to call the clinic with any problems, questions or concerns. I spent 30 minutes in the care of this patient including H and P, review of records, counseling and coordination of care.     Rachel Moulds, MD 04/25/2022 4:52 PM

## 2022-04-25 NOTE — Assessment & Plan Note (Signed)
This is a very pleasant 57 year old female patient with DCIS now on tamoxifen for adjuvant antiestrogen therapy who is here for follow-up.  Since her last visit here, she continues to report pain in the left breast especially in the inframammary region and she says she wont be able to take a mammogram.  She requests other forms of breast cancer screening hence we have discussed about MRI and this has been ordered.  She asked for an ultrasound but we have recommended that ultrasound alone is not a good screening method.  With regards to the tenderness, this is likely posttreatment changes, no palpable masses.  She has no major adverse effects from tamoxifen, she does have some weight gain of about 20 pounds but she states she has been eating twice the amount.  I have encouraged her to cut down on portion intake as well as to start becoming more active regularly.  She expressed understanding.  No other major concerns today.  She will come back to see [place] in about 6 months or sooner as needed.

## 2022-04-26 ENCOUNTER — Telehealth: Payer: Self-pay | Admitting: Hematology and Oncology

## 2022-04-26 NOTE — Telephone Encounter (Signed)
Left patient a vm regarding upcoming appointment  

## 2022-05-21 ENCOUNTER — Ambulatory Visit (HOSPITAL_COMMUNITY): Payer: BC Managed Care – PPO

## 2022-05-23 IMAGING — MG MM PLC BREAST LOC DEV 1ST LESION INC MAMMO GUIDE*L*
6 series · 6 of 6 positions shown · non-contrast
Comparison: Previous exam(s).

CLINICAL DATA: Radioactive seed localization of the left breast
prior to lumpectomy.

EXAM:
MAMMOGRAPHIC GUIDED RADIOACTIVE SEED LOCALIZATION OF THE LEFT BREAST

[L LM (1 of 3)]
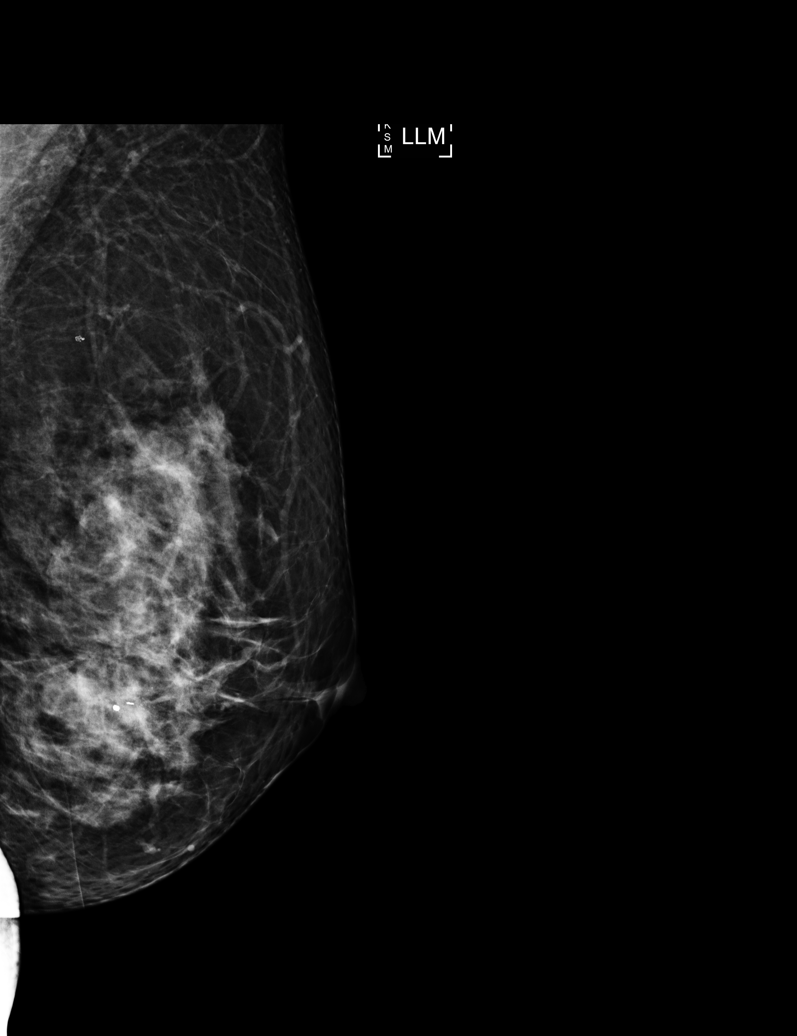

[L CC (1 of 3)]
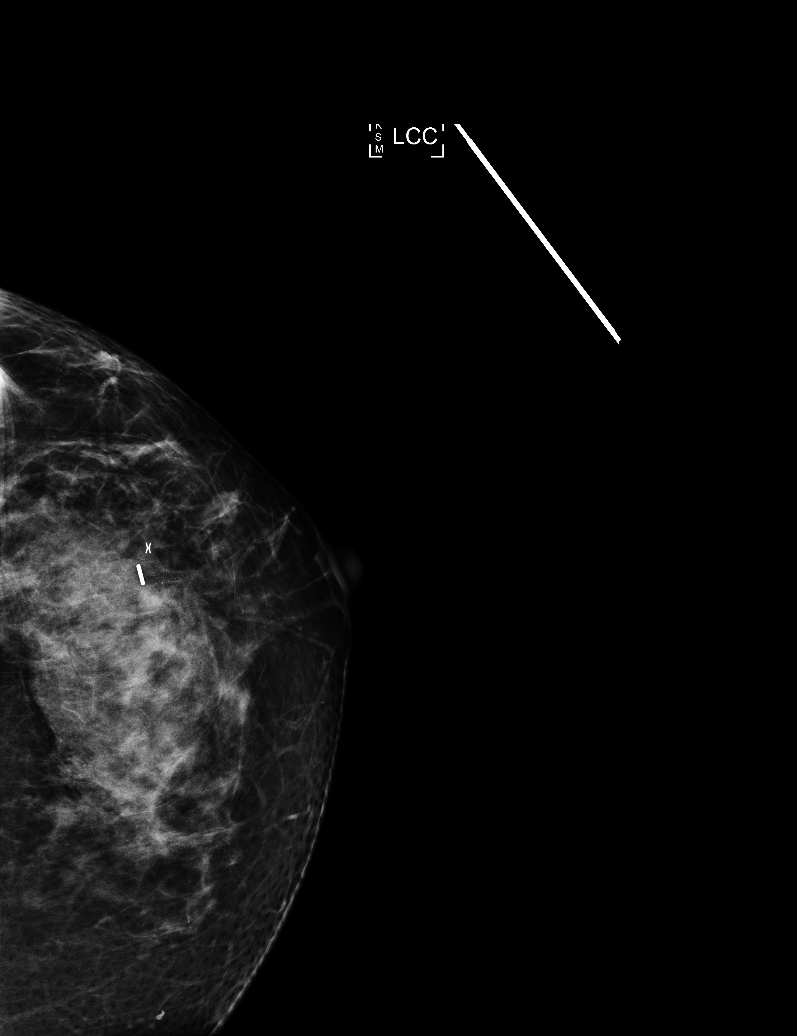

[L LM (2 of 3)]
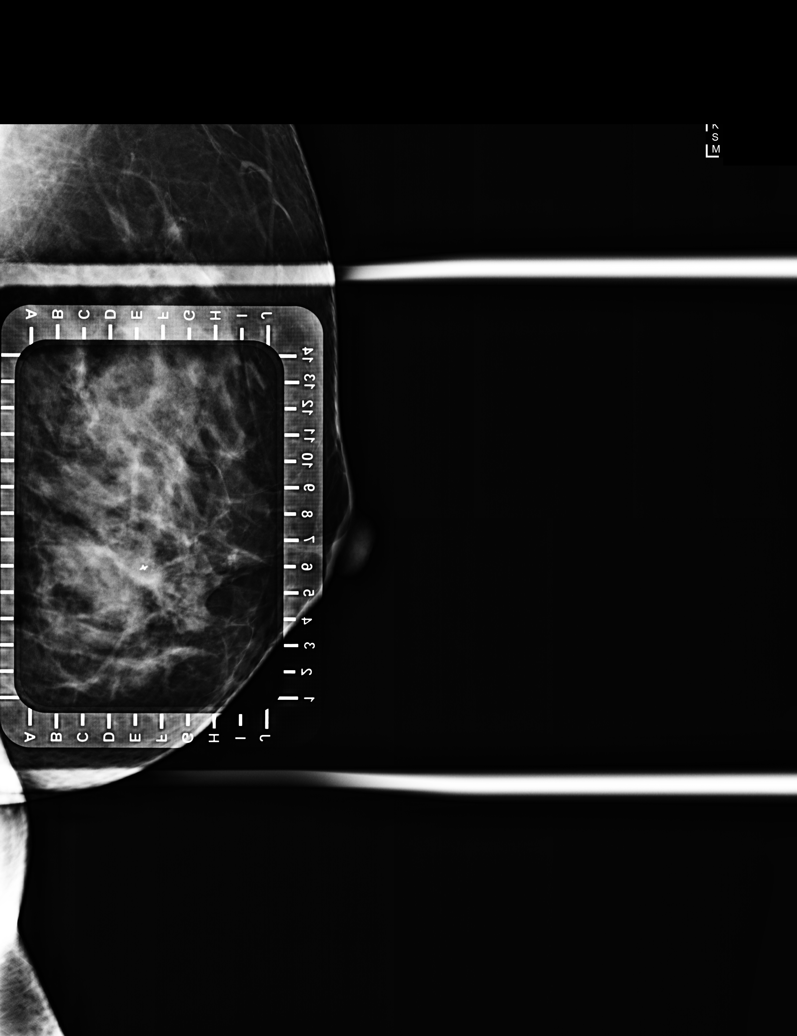

[L LM (3 of 3)]
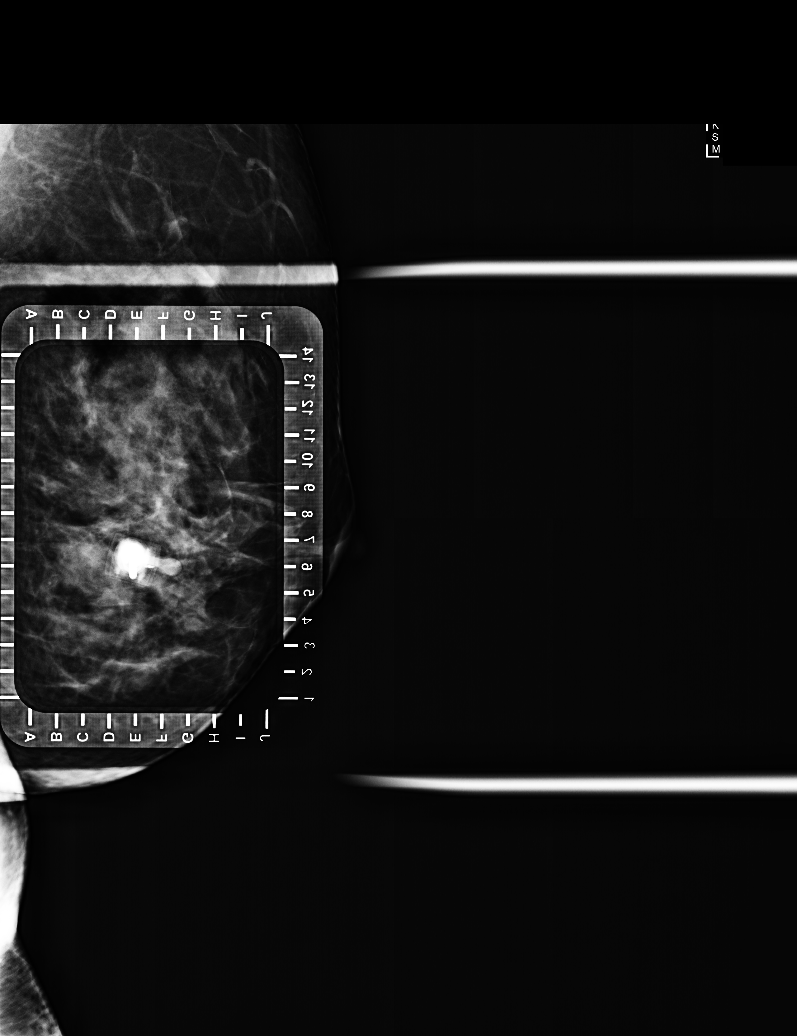

[L CC (2 of 3)]
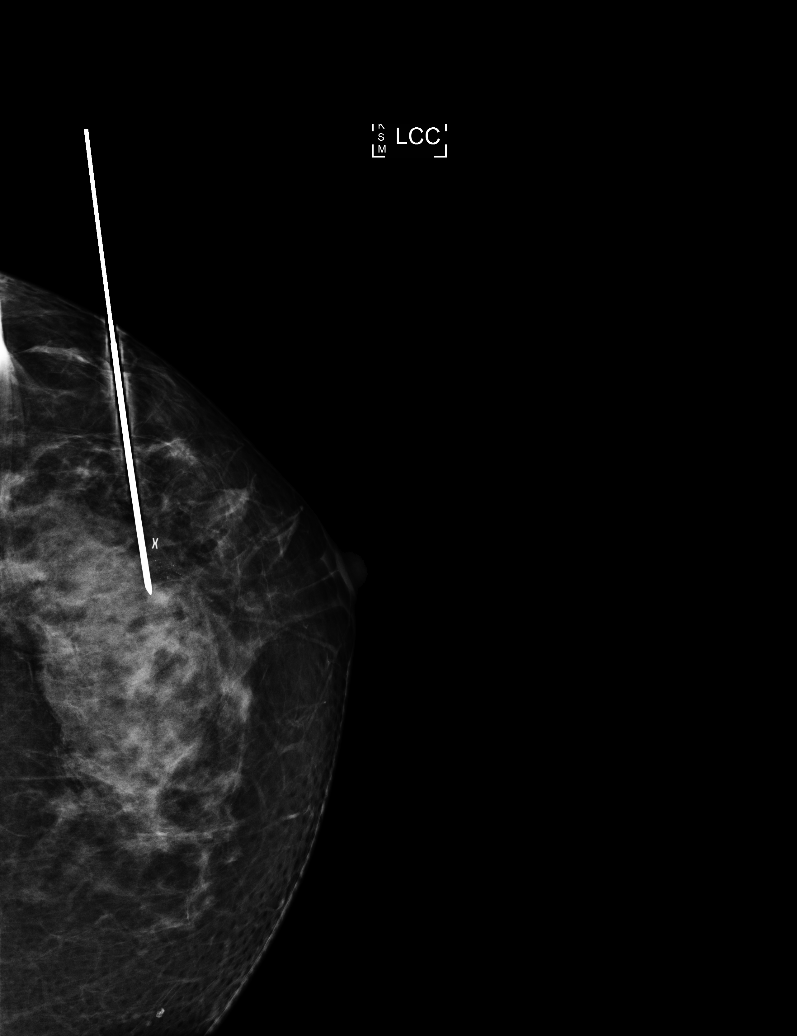

[L CC (3 of 3)]
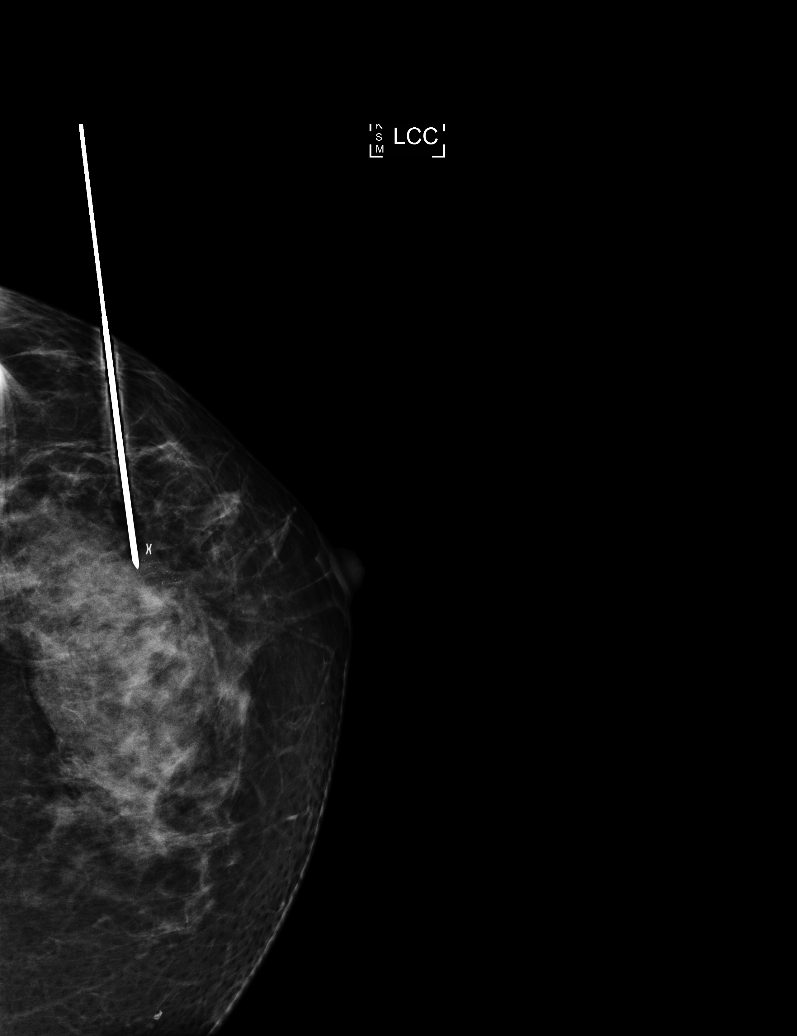

[6 of 6 positions shown; findings below may reference images not displayed]

FINDINGS: Patient presents for radioactive seed localization prior to left
breast lumpectomy. I met with the patient and we discussed the
procedure of seed localization including benefits and alternatives.
We discussed the high likelihood of a successful procedure. We
discussed the risks of the procedure including infection, bleeding,
tissue injury and further surgery. We discussed the low dose of
radioactivity involved in the procedure. Informed, written consent
was given.

The usual time-out protocol was performed immediately prior to the
procedure.

Using mammographic guidance, sterile technique, 1% lidocaine and an
U-TZC radioactive seed, the X shaped biopsy marking clip in the
lower outer left breast was localized using a lateral approach. The
follow-up mammogram images confirm the seed in the expected location
and were marked for Dr. Poveda.

Follow-up survey of the patient confirms presence of the radioactive
seed.

Order number of U-TZC seed:  [id number].

Total activity:  0.245 millicuries reference Date: 02/20/2021

The patient tolerated the procedure well and was released from the
[REDACTED]. She was given instructions regarding seed removal.
IMPRESSION: Radioactive seed localization left breast. No apparent
complications.

## 2022-05-24 IMAGING — US MM BREAST SURGICAL SPECIMEN
1 series · 14 of 14 positions shown · non-contrast
Comparison: Previous exam(s).

CLINICAL DATA: Status post left breast surgery

EXAM:
SPECIMEN RADIOGRAPH OF THE LEFT BREAST

[Series 1: mm breast surgical specimen · 0.06mm/px · 14 of 14 slices shown]
[im 1/14]
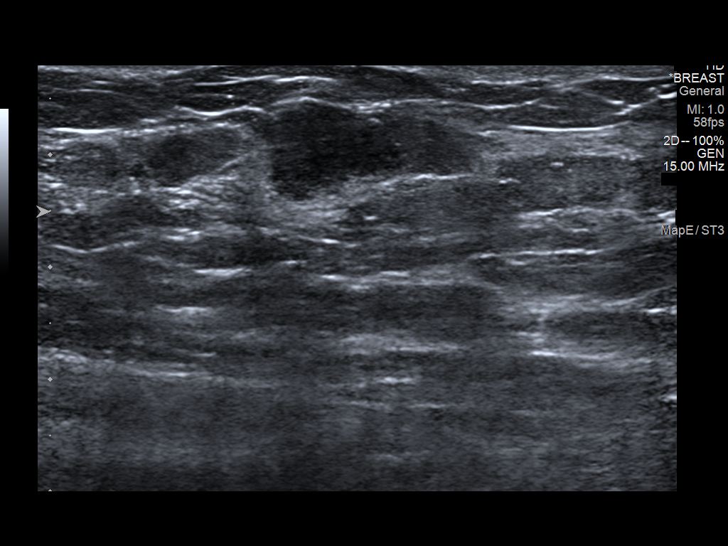
[im 2/14]
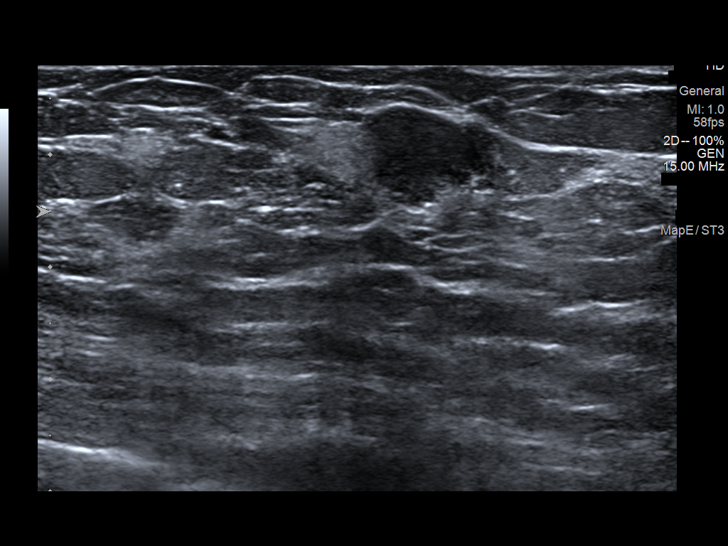
[im 3/14]
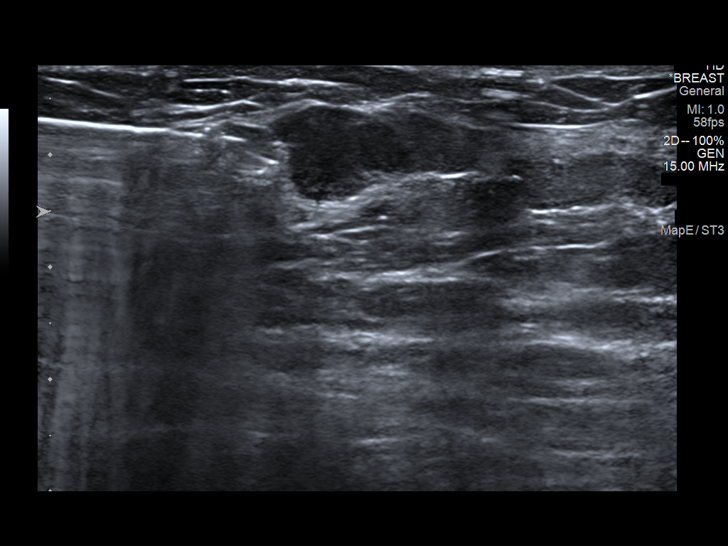
[im 4/14]
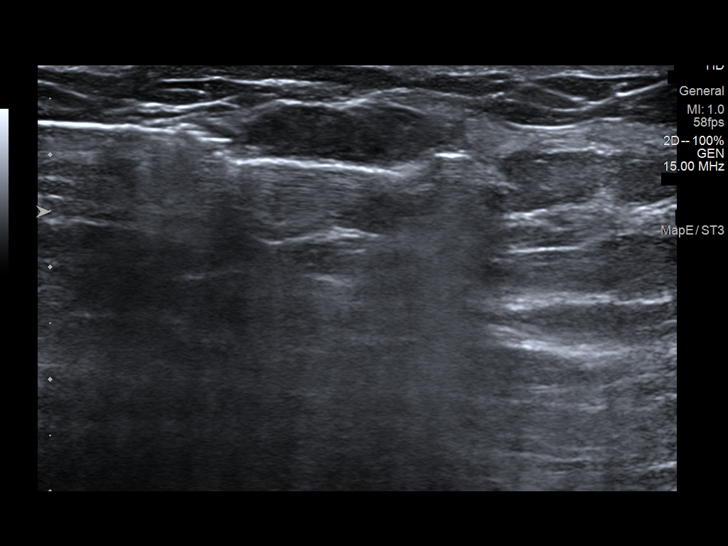
[im 5/14]
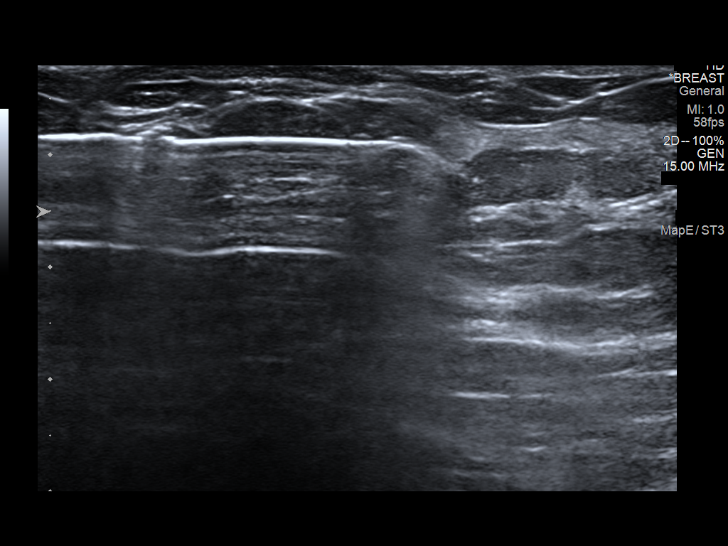
[im 6/14]
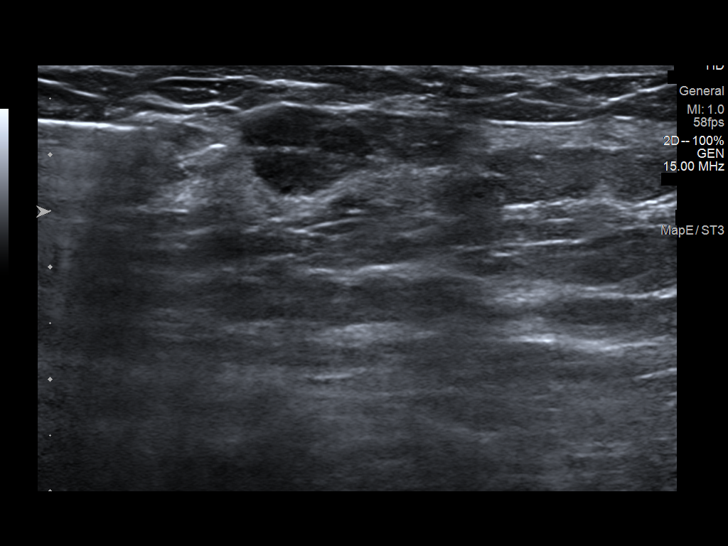
[im 7/14]
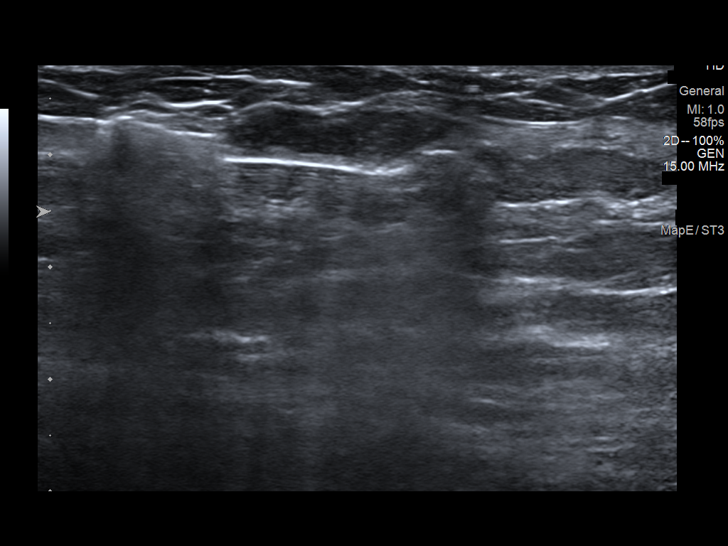
[im 8/14]
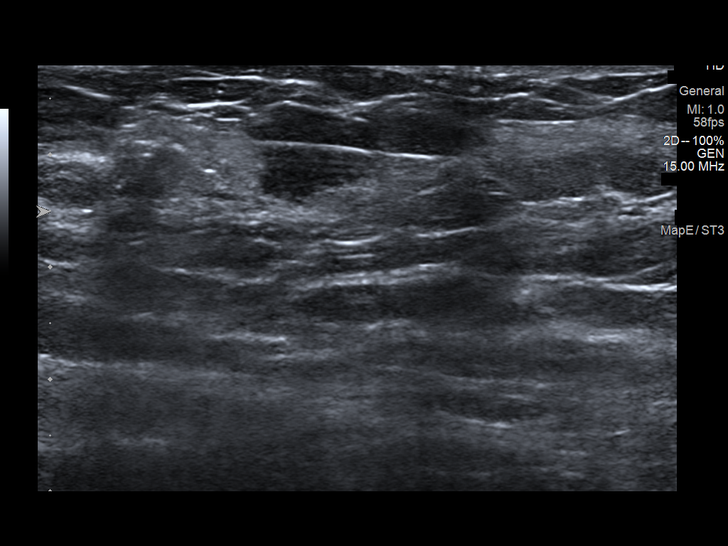
[im 9/14]
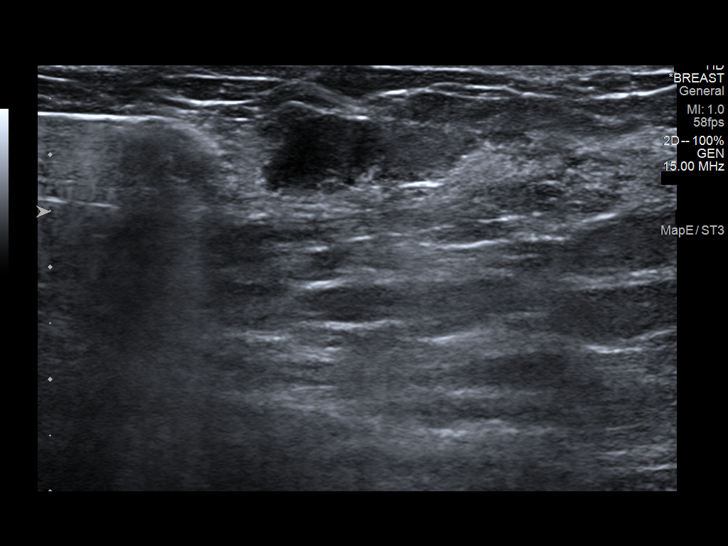
[im 10/14]
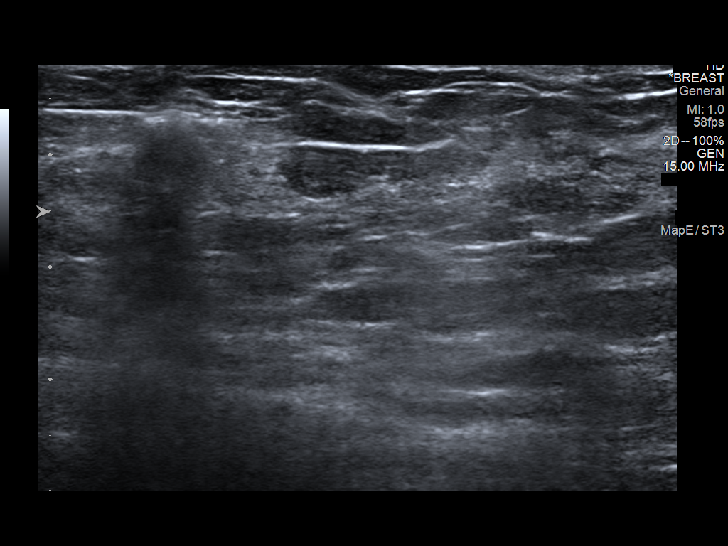
[im 11/14]
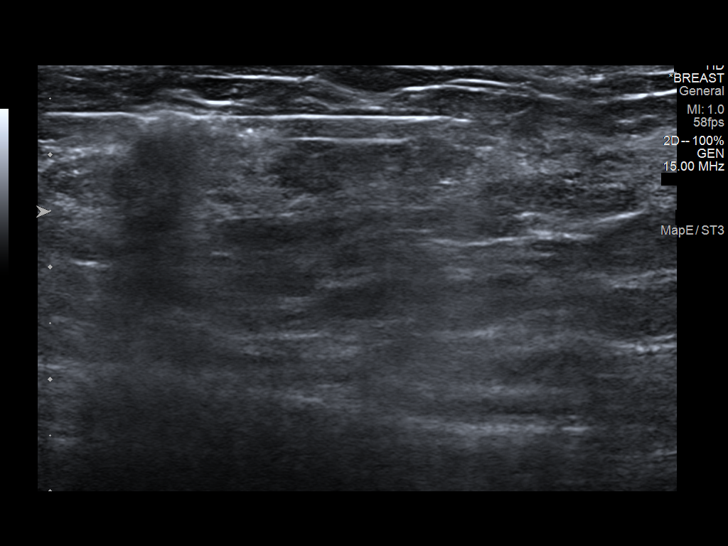
[im 12/14]
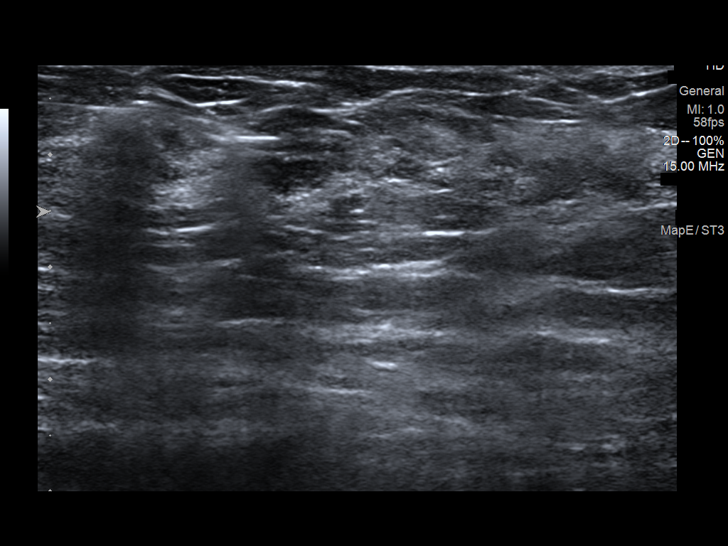
[im 13/14]
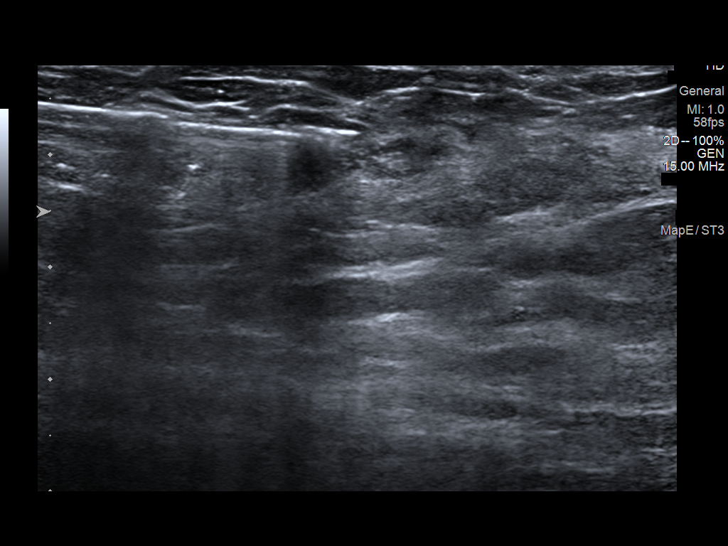
[im 14/14]
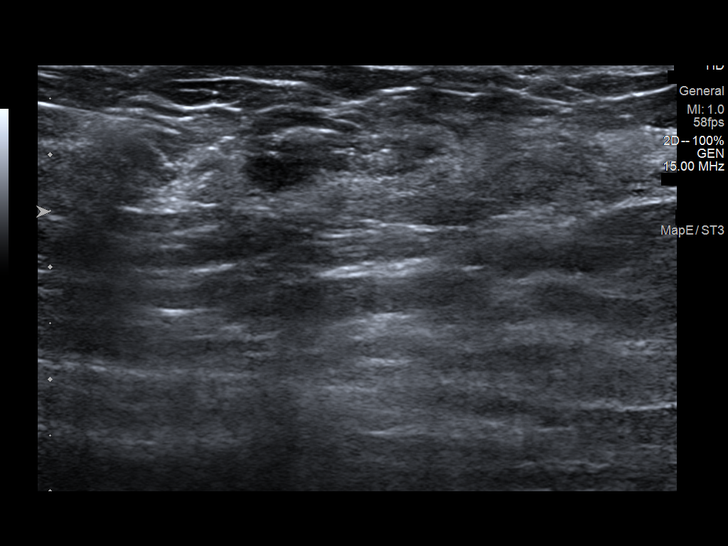

[14 of 14 positions shown; findings below may reference images not displayed]

FINDINGS: Status post excision of the left breast. The radioactive seed and
biopsy marker clip are present, completely intact, and were marked
for pathology.
IMPRESSION: Specimen radiograph of the left breast.

## 2022-06-05 ENCOUNTER — Ambulatory Visit (HOSPITAL_COMMUNITY)
Admission: RE | Admit: 2022-06-05 | Discharge: 2022-06-05 | Disposition: A | Discharge status: Home / Self Care | Payer: BC Managed Care – PPO | Source: Ambulatory Visit | Attending: Hematology and Oncology | Admitting: Hematology and Oncology

## 2022-06-05 ENCOUNTER — Ambulatory Visit (HOSPITAL_COMMUNITY): Payer: BC Managed Care – PPO

## 2022-06-05 DIAGNOSIS — Z1239 Encounter for other screening for malignant neoplasm of breast: Secondary | ICD-10-CM | POA: Diagnosis not present

## 2022-06-05 DIAGNOSIS — D0512 Intraductal carcinoma in situ of left breast: Secondary | ICD-10-CM | POA: Diagnosis not present

## 2022-06-05 MED ORDER — GADOBUTROL 1 MMOL/ML IV SOLN
9.0000 mL | Freq: Once | INTRAVENOUS | Status: AC | PRN
  Administered 2022-06-05: 9 mL via INTRAVENOUS

## 2022-06-08 ENCOUNTER — Other Ambulatory Visit: Payer: Self-pay | Admitting: Hematology and Oncology

## 2022-06-08 DIAGNOSIS — D0512 Intraductal carcinoma in situ of left breast: Secondary | ICD-10-CM

## 2022-06-08 NOTE — Progress Notes (Signed)
Mammogram ordered per radiology recommendations.  Suleman Gunning

## 2022-06-13 ENCOUNTER — Other Ambulatory Visit: Payer: BC Managed Care – PPO

## 2022-07-04 ENCOUNTER — Other Ambulatory Visit: Payer: Self-pay | Admitting: Hematology and Oncology

## 2022-08-17 DIAGNOSIS — Z0000 Encounter for general adult medical examination without abnormal findings: Secondary | ICD-10-CM | POA: Diagnosis not present

## 2022-08-17 DIAGNOSIS — E559 Vitamin D deficiency, unspecified: Secondary | ICD-10-CM | POA: Diagnosis not present

## 2022-08-17 DIAGNOSIS — Z79899 Other long term (current) drug therapy: Secondary | ICD-10-CM | POA: Diagnosis not present

## 2022-08-17 DIAGNOSIS — E7800 Pure hypercholesterolemia, unspecified: Secondary | ICD-10-CM | POA: Diagnosis not present

## 2022-08-17 DIAGNOSIS — R7309 Other abnormal glucose: Secondary | ICD-10-CM | POA: Diagnosis not present

## 2022-08-23 ENCOUNTER — Encounter: Payer: Self-pay | Admitting: Family Medicine

## 2022-10-29 ENCOUNTER — Other Ambulatory Visit: Payer: Self-pay

## 2022-10-29 ENCOUNTER — Inpatient Hospital Stay: Payer: BC Managed Care – PPO | Attending: Hematology and Oncology | Admitting: Hematology and Oncology

## 2022-10-29 VITALS — BP 145/62 | HR 87 | Temp 97.5°F | Resp 18 | Ht 65.0 in | Wt 180.8 lb

## 2022-10-29 DIAGNOSIS — Z79810 Long term (current) use of selective estrogen receptor modulators (SERMs): Secondary | ICD-10-CM | POA: Diagnosis not present

## 2022-10-29 DIAGNOSIS — N951 Menopausal and female climacteric states: Secondary | ICD-10-CM | POA: Diagnosis not present

## 2022-10-29 DIAGNOSIS — C50912 Malignant neoplasm of unspecified site of left female breast: Secondary | ICD-10-CM | POA: Diagnosis not present

## 2022-10-29 DIAGNOSIS — Z170 Estrogen receptor positive status [ER+]: Secondary | ICD-10-CM | POA: Diagnosis not present

## 2022-10-29 DIAGNOSIS — R682 Dry mouth, unspecified: Secondary | ICD-10-CM | POA: Insufficient documentation

## 2022-10-29 DIAGNOSIS — Z803 Family history of malignant neoplasm of breast: Secondary | ICD-10-CM | POA: Insufficient documentation

## 2022-10-29 DIAGNOSIS — M7918 Myalgia, other site: Secondary | ICD-10-CM | POA: Diagnosis not present

## 2022-10-29 DIAGNOSIS — E162 Hypoglycemia, unspecified: Secondary | ICD-10-CM | POA: Insufficient documentation

## 2022-10-29 NOTE — Assessment & Plan Note (Addendum)
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    Breast Cancer Surveillance MRI results are satisfactory with no new findings. Mammogram is due, but patient has concerns due to pain associated with the procedure and presence of keloids. -Schedule mammogram for early January 2025.  Tamoxifen Side Effects Patient reports multiple symptoms including hot flashes, weight gain, and dry mouth.  I do not believe symptoms of hypoglycemia and jaw spasms cannot be attributed to Tamoxifen. -Continue Tamoxifen as prescribed. -Encourage regular exercise and a diet high in protein and low in carbohydrates to manage weight gain.  Perceived Hypoglycemia Patient reports symptoms of hypoglycemia despite not being diabetic.  It is expected to have fluctuations in blood sugar even in non diabetics. Advise patient to maintain regular meal schedule and not to eat based on blood sugar readings alone She tells me that she had HbA1c testing and is not a diabetic.  Musculoskeletal Pain Patient reports soreness in the hands and knees. Primary care physician has prescribed Vitamin D. -Encourage patient to continue with Vitamin D as prescribed by primary care physician. -Recommend low-impact exercises and weightlifting to improve muscle tone and joint health.  General Health Maintenance -Continue regular follow-ups with breast surgeon. -Next appointment with current provider in one year, or sooner if any changes occur.

## 2022-10-29 NOTE — Progress Notes (Signed)
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    Breast Cancer Surveillance MRI results are satisfactory with no new findings. Mammogram is due, but patient has concerns due to pain associated with the procedure and presence of keloids. -Schedule mammogram for early January 2025.  Tamoxifen Side Effects Patient reports multiple symptoms including hot flashes, weight gain, and dry mouth.  I do not believe symptoms of hypoglycemia and jaw spasms cannot be attributed to Tamoxifen. -Continue Tamoxifen as prescribed. -Encourage regular exercise and a diet high in protein and low in carbohydrates to manage weight gain.  Perceived Hypoglycemia Patient reports symptoms of hypoglycemia despite not being diabetic.  It is expected to have fluctuations in blood sugar even in non diabetics. Advise patient to maintain regular meal schedule and not to eat based on blood sugar readings alone She tells me that she had HbA1c testing and is not a diabetic.  Musculoskeletal Pain Patient reports soreness in the hands and knees. Primary care physician has prescribed Vitamin D. -Encourage patient to continue with Vitamin D as prescribed by primary care physician. -Recommend low-impact exercises and weightlifting to improve muscle tone and joint health.  General Health Maintenance -Continue regular follow-ups with breast surgeon. -Next appointment with current  provider in one year, or sooner if any changes occur.    No orders of the defined types were placed in this encounter. HISTORY OF PRESENTING ILLNESS:   IMAGENE BOSS 58 y.o. 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 01/18/2021 diagnostic mammogram of the left breast showed suspicious area of distortion with calcifications in the lateral left breast, no evidence of left axillary lymphadenopathy. Biopsy from the left breast mass showed ductal carcinoma in situ, nuclear grade 2, cribriform/comedo/solid patterns with central necrosis and calcifications, longest involved segment 4 mm in greatest length, ER +100%, strong staining intensity, PR +30%, strong staining intensity She underwent left breast lumpectomy, pathology shows intermediate to high-grade DCIS with necrosis and calcifications measuring approximately 2.4 cm, resection margins negative for DCIS no evidence of invasive carcinoma.  Prior prognostics showed ER/PR positive tumor She is s/p adjuvant radiation.  Interval history  The patient, with a history of breast cancer, presents for a routine follow-up. She expresses concern about the necessity of a mammogram, given the discomfort she experiences due to keloids. Despite the discomfort, she agrees to schedule a mammogram for early next year.  The patient also reports a variety of symptoms that she attributes to her medication, Tamoxifen. She describes feeling weak and sweaty if she does not eat every two to three hours, leading to frequent eating and subsequent weight gain. She also reports dry mouth, occasional jaw spasms, and hot flashes. She has been monitoring her blood sugar levels due to these symptoms, noting fluctuations that concern her.  In addition to these symptoms, the patient reports knee  pain and has been  taking vitamin D as recommended by her primary care physician.   Wt Readings from Last 3 Encounters: 04/25/22 183 lb 1.6 oz (83.1 kg) 10/17/21 177 lb 9.6 oz (80.6 kg) 10/16/21 178 lb (80.7 kg)    MEDICAL HISTORY:  Past Medical History:  Diagnosis Date   Cancer (HCC) 02/03/2021   Left breast DCIS   GERD (gastroesophageal reflux disease)    Headache    PCOS (polycystic ovarian syndrome)     SURGICAL HISTORY: Past Surgical History:  Procedure Laterality Date   BREAST BIOPSY Left 01/26/2021   stereo biopsy/ x clip/ path pending   BREAST LUMPECTOMY WITH RADIOACTIVE SEED LOCALIZATION Left 02/24/2021   Procedure: LEFT BREAST LUMPECTOMY WITH RADIOACTIVE SEED LOCALIZATION;  Surgeon: Griselda Miner, MD;  Location: [facility];  Service: General;  Laterality: Left;   COLONOSCOPY      SOCIAL HISTORY: Social History   Socioeconomic History   Marital status: Married    Spouse name: Not on file   Number of children: Not on file   Years of education: Not on file   Highest education level: Not on file  Occupational History   Not on file  Tobacco Use   Smoking status: Never   Smokeless tobacco: Never  Vaping Use   Vaping status: Never Used  Substance and Sexual Activity   Alcohol use: Never   Drug use: Never   Sexual activity: Not on file  Other Topics Concern   Not on file  Social History Narrative   Not on file   Social Determinants of Health   Financial Resource Strain: Not on file  Food Insecurity: Not on file  Transportation Needs: Not on file  Physical Activity: Not on file  Stress: Not on file  Social Connections: Not on file  Intimate Partner Violence: Not on file    FAMILY HISTORY: Family History  Problem Relation Age of Onset   Breast cancer Mother    Prostate cancer Father    Mom had it in 60/70 Dad died from metastatic prostate cancer at 65.  ALLERGIES:  is allergic to latex.  MEDICATIONS:  Current Outpatient  Medications  Medication Sig Dispense Refill   b complex vitamins capsule Take by mouth daily.     ibuprofen (ADVIL) 200 MG tablet Take by mouth.     magnesium 30 MG tablet Take by mouth 2 (two) times daily.     omeprazole (PRILOSEC) 10 MG capsule Take by mouth.     pyridOXINE (VITAMIN B-6) 100 MG tablet Take 100 mg by mouth daily.     tamoxifen (NOLVADEX) 20 MG tablet TAKE 1 TABLET BY MOUTH EVERY DAY 90 tablet 1   VITAMIN D PO Take by mouth.     No current facility-administered medications for this visit.     PHYSICAL EXAMINATION: ECOG PERFORMANCE STATUS: 0 - Asymptomatic  Vitals:   10/29/22 1303  BP: (!) 145/62  Pulse: 87  Resp: 18  Temp: (!) 97.5 F (36.4 C)  SpO2: 98%   Filed Weights   10/29/22 1303  Weight: 180 lb 12.8 oz (82 kg)   Physical Exam Constitutional:      Appearance: Normal appearance.  Chest:     Comments: Left breast with postop changes.  No palpable masses or regional adenopathy. Right breast normal to inspection and palpation Neurological:     Mental Status: She is alert.   At 444 LABORATORY DATA:  I have reviewed the data as listed No results found for: "WBC", "HGB", "HCT", "  MCV", "PLT"   Chemistry   No results found for: "NA", "K", "CL", "CO2", "BUN", "CREATININE", "GLU" No results found for: "CALCIUM", "ALKPHOS", "AST", "ALT", "BILITOT"     RADIOGRAPHIC STUDIES: I have personally reviewed the radiological images as listed and agreed with the findings in the report. No results found.  All questions were answered. The patient knows to call the clinic with any problems, questions or concerns. I spent 30 minutes in the care of this patient including H and P, review of records, counseling and coordination of care.     Rachel Moulds, MD 10/29/2022 2:16 PM

## 2022-12-25 ENCOUNTER — Other Ambulatory Visit: Payer: Self-pay | Admitting: Hematology and Oncology

## 2023-01-29 ENCOUNTER — Ambulatory Visit
Admission: RE | Admit: 2023-01-29 | Discharge: 2023-01-29 | Disposition: A | Discharge status: Home / Self Care | Payer: 59 | Source: Ambulatory Visit | Attending: Hematology and Oncology | Admitting: Hematology and Oncology

## 2023-01-29 ENCOUNTER — Other Ambulatory Visit: Payer: Self-pay | Admitting: Hematology and Oncology

## 2023-01-29 DIAGNOSIS — D0512 Intraductal carcinoma in situ of left breast: Secondary | ICD-10-CM

## 2023-01-29 HISTORY — DX: Personal history of irradiation: Z92.3

## 2023-05-15 ENCOUNTER — Other Ambulatory Visit: Payer: Self-pay | Admitting: Hematology and Oncology

## 2023-06-05 ENCOUNTER — Encounter: Payer: Self-pay | Admitting: Family Medicine

## 2023-10-25 ENCOUNTER — Telehealth: Payer: Self-pay | Admitting: Hematology and Oncology

## 2023-10-25 ENCOUNTER — Ambulatory Visit: Admitting: Pulmonary Disease

## 2023-10-25 ENCOUNTER — Encounter: Payer: Self-pay | Admitting: Pulmonary Disease

## 2023-10-25 VITALS — BP 124/71 | HR 77 | Temp 98.0°F | Ht 65.0 in | Wt 175.6 lb

## 2023-10-25 DIAGNOSIS — R911 Solitary pulmonary nodule: Secondary | ICD-10-CM | POA: Diagnosis not present

## 2023-10-25 NOTE — Progress Notes (Signed)
 Frances Patton    [id number]    [birth year]  Primary Care Physician:Koirala, Dibas, MD  Referring Physician: Ryan Reena DEL, PA No address on file  Chief complaint:   Follow-up for abnormal CT  HPI:  Patient was seen here about 2 years ago showing some ground glass changes at the bottom of the left lung, nodule in the left lung  Has been having some GI symptoms recently and she was encouraged by a GI doctor and her primary doctor to make sure she follows up  Not having any respiratory complaints at present Denies any chest pains or chest discomfort  Intentional weight loss  She does have a history of breast cancer for which she had radiation treatments  Patient also has obstructive sleep apnea - She did procure a machine - Uses the machine intermittently  We do not have a download from the machine to follow-up on compliance  She does have coverage issues Did not follow-up on the CT previously because she lost her insurance  Never smoker No pertinent occupational history/predisposition to lung disease  She did have asthma growing up  Has not been using the CPAP regularly recently, stress about her health overall  She does have occasional reflux  Outpatient Encounter Medications as of 10/25/2023  Medication Sig   b complex vitamins capsule Take by mouth daily.   ibuprofen (ADVIL) 200 MG tablet Take by mouth.   magnesium 30 MG tablet Take by mouth 2 (two) times daily.   pantoprazole (PROTONIX) 40 MG tablet Take 40 mg by mouth daily.   pyridOXINE (VITAMIN B-6) 100 MG tablet Take 100 mg by mouth daily.   tamoxifen  (NOLVADEX ) 20 MG tablet TAKE 1 TABLET BY MOUTH EVERY DAY   VITAMIN D PO Take by mouth.   [DISCONTINUED] omeprazole (PRILOSEC) 10 MG capsule Take by mouth.   No facility-administered encounter medications on file as of 10/25/2023.    Allergies as of 10/25/2023 - Review Complete 10/25/2023  Allergen Reaction Noted   Latex Itching 02/15/2021     Past Medical History:  Diagnosis Date   Cancer (HCC) 02/03/2021   Left breast DCIS   GERD (gastroesophageal reflux disease)    Headache    PCOS (polycystic ovarian syndrome)    Personal history of radiation therapy     Past Surgical History:  Procedure Laterality Date   BREAST BIOPSY Left 01/26/2021   stereo biopsy/ x clip/ path pending   BREAST LUMPECTOMY     BREAST LUMPECTOMY WITH RADIOACTIVE SEED LOCALIZATION Left 02/24/2021   Procedure: LEFT BREAST LUMPECTOMY WITH RADIOACTIVE SEED LOCALIZATION;  Surgeon: Curvin Deward MOULD, MD;  Location: [facility];  Service: General;  Laterality: Left;   COLONOSCOPY      Family History  Problem Relation Age of Onset   Breast cancer Mother    Prostate cancer Father     Social History   Socioeconomic History   Marital status: Married    Spouse name: Not on file   Number of children: Not on file   Years of education: Not on file   Highest education level: Not on file  Occupational History   Not on file  Tobacco Use   Smoking status: Never   Smokeless tobacco: Never  Vaping Use   Vaping status: Never Used  Substance and Sexual Activity   Alcohol use: Never   Drug use: Never   Sexual activity: Not on file  Other Topics Concern   Not on  file  Social History Narrative   Not on file   Social Drivers of Health   Financial Resource Strain: Not on file  Food Insecurity: Not on file  Transportation Needs: Not on file  Physical Activity: Not on file  Stress: Not on file  Social Connections: Not on file  Intimate Partner Violence: Not on file    Review of Systems  Constitutional:  Negative for fatigue.  Respiratory:  Positive for shortness of breath.   Psychiatric/Behavioral:  Positive for sleep disturbance.     Vitals:   10/25/23 1103  BP: 124/71  Pulse: 77  Temp: 98 F (36.7 C)  SpO2: 98%     Physical Exam Constitutional:      Appearance: Normal appearance.  HENT:     Head: Normocephalic.      Mouth/Throat:     Mouth: Mucous membranes are moist.  Cardiovascular:     Rate and Rhythm: Normal rate and regular rhythm.     Heart sounds: No murmur heard.    No friction rub.  Pulmonary:     Effort: No respiratory distress.     Breath sounds: No stridor. No wheezing or rhonchi.  Musculoskeletal:     Cervical back: No rigidity.  Neurological:     Mental Status: She is alert.  Psychiatric:        Mood and Affect: Mood normal.      Data Reviewed:  Multiple CTs reviewed CT scan of the abdomen reviewed, compared with CT scan from December 2023  Some ground glass changes at the bases of the lungs, nodule in the left lung  Sleep study with AHI of 22 with mild oxygen desaturations  Previous PFT with mild restriction with normal diffusing capacity  Assessment:  Abnormal CT scan of the chest showing a left lung nodule, some atelectatic changes  Moderate obstructive sleep apnea for which CPAP was recommended - She did procure her machine - Has not been using it regularly - We do not have download from the machine  Past history of breast cancer for which she had radiation treatments  Radiation therapy may be responsible for some scarring in the lungs and this may be what we are seeing on the CT scan, the only way to follow this up is by repeating the CT scan This had been requested previously but because she could not afford it, was not done  Plan/Recommendations: Repeat CT scan of the chest  Encouraged to continue using CPAP  Follow-up in about 4 weeks  Encouraged to call with significant concerns  I spent 30 minutes dedicated to the care of this patient on the date of this encounter to include previsit review of records, face-to-face time with the patient discussing conditions above, post visit ordering of testing,ordering medications,independentlyinterpreting results, clinical documentation with electronic health record and communicated necessary findings to members of  the patient's care team  Jennet Epley MD [facility] 10/25/2023, 11:26 AM  CC: Ryan Reena DEL, PA

## 2023-10-25 NOTE — Patient Instructions (Signed)
 I will see you back in a month  Let us  make sure we get the CT scan of the chest to follow-up on the spot in the lung  Call us  with significant concerns

## 2023-10-25 NOTE — Telephone Encounter (Signed)
 Left voicemail for patient regarding rescheduled appointment from 10/31/2023 to 11/20/2023 due to provider being on call.

## 2023-10-25 NOTE — Progress Notes (Signed)
 Frances Patton    [id number]    01/10/1966  Primary Care Physician:Koirala, Dibas, MD  Referring Physician: Ryan Reena DEL, PA No address on file  Chief complaint:   Patient is being seen for abnormal CT scan of the chest showing groundglass changes at the bases of the lungs Shortness of breath on exertion  HPI:  She had a CT scan for abdominal discomfort showing bibasal groundglass changes, small lung nodule left lung  No prior history of lung disease  Recent diagnosis of breast cancer for which she received radiation treatments, post surgery.  She does have some chest discomfort which is felt to be related to recent surgery  She does admit to shortness of breath on exertion No chronic cough  Recently had a sleep study showing moderate obstructive sleep apnea with mild oxygen desaturations -Treatment options discussed -We will initiate CPAP therapy  Never smoker No pertinent occupational history/predisposition to lung disease  She did have asthma growing up  Admits to snoring, no witnessed apneas, occasional choking episodes She does have daytime headaches sometimes unrelated to waking up in the morning Usually goes to bed between 11 and 12 Wakes up about 7 to 9 AM Admits to dryness of her throat in the mornings  She denies a chronic cough Denies any musculoskeletal pain apart from that related to recent surgery No arthritic pains No skin rash  She does have occasional reflux  Outpatient Encounter Medications as of 10/25/2023  Medication Sig  . b complex vitamins capsule Take by mouth daily.  SABRA ibuprofen (ADVIL) 200 MG tablet Take by mouth.  . magnesium 30 MG tablet Take by mouth 2 (two) times daily.  . pantoprazole (PROTONIX) 40 MG tablet Take 40 mg by mouth daily.  SABRA pyridOXINE (VITAMIN B-6) 100 MG tablet Take 100 mg by mouth daily.  . tamoxifen  (NOLVADEX ) 20 MG tablet TAKE 1 TABLET BY MOUTH EVERY DAY  . VITAMIN D PO Take by mouth.  .  [DISCONTINUED] omeprazole (PRILOSEC) 10 MG capsule Take by mouth.   No facility-administered encounter medications on file as of 10/25/2023.    Allergies as of 10/25/2023 - Review Complete 10/25/2023  Allergen Reaction Noted  . Latex Itching 02/15/2021    Past Medical History:  Diagnosis Date  . Cancer (HCC) 02/03/2021   Left breast DCIS  . GERD (gastroesophageal reflux disease)   . Headache   . PCOS (polycystic ovarian syndrome)   . Personal history of radiation therapy     Past Surgical History:  Procedure Laterality Date  . BREAST BIOPSY Left 01/26/2021   stereo biopsy/ x clip/ path pending  . BREAST LUMPECTOMY    . BREAST LUMPECTOMY WITH RADIOACTIVE SEED LOCALIZATION Left 02/24/2021   Procedure: LEFT BREAST LUMPECTOMY WITH RADIOACTIVE SEED LOCALIZATION;  Surgeon: Curvin Deward MOULD, MD;  Location: [facility];  Service: General;  Laterality: Left;  . COLONOSCOPY      Family History  Problem Relation Age of Onset  . Breast cancer Mother   . Prostate cancer Father     Social History   Socioeconomic History  . Marital status: Married    Spouse name: Not on file  . Number of children: Not on file  . Years of education: Not on file  . Highest education level: Not on file  Occupational History  . Not on file  Tobacco Use  . Smoking status: Never  . Smokeless tobacco: Never  Vaping Use  . Vaping  status: Never Used  Substance and Sexual Activity  . Alcohol use: Never  . Drug use: Never  . Sexual activity: Not on file  Other Topics Concern  . Not on file  Social History Narrative  . Not on file   Social Drivers of Health   Financial Resource Strain: Not on file  Food Insecurity: Not on file  Transportation Needs: Not on file  Physical Activity: Not on file  Stress: Not on file  Social Connections: Not on file  Intimate Partner Violence: Not on file    Review of Systems  Constitutional:  Negative for fatigue.  Respiratory:  Positive for  shortness of breath.   Psychiatric/Behavioral:  Positive for sleep disturbance.     Vitals:   10/25/23 1103  BP: 124/71  Pulse: 77  Temp: 98 F (36.7 C)  SpO2: 98%     Physical Exam Constitutional:      Appearance: Normal appearance.  HENT:     Head: Normocephalic.     Mouth/Throat:     Mouth: Mucous membranes are moist.  Cardiovascular:     Rate and Rhythm: Normal rate and regular rhythm.     Heart sounds: No murmur heard.    No friction rub.  Pulmonary:     Effort: No respiratory distress.     Breath sounds: No stridor. No wheezing or rhonchi.  Musculoskeletal:     Cervical back: No rigidity.  Neurological:     Mental Status: She is alert.  Psychiatric:        Mood and Affect: Mood normal.      Data Reviewed: CT scan of the abdomen was reviewed with the patient showing the bibasal groundglass changes No other previous CT scans to compare this with  CT reviewed with the patient today with mild groundglass changes, left midlung nodule  Sleep study did reveal moderate obstructive sleep apnea with AHI of about 22, mild oxygen desaturations  PFT today shows no significant obstructive disease with no significant bronchodilator response, mild restriction with normal diffusing capacity  Assessment:  Dyspnea on exertion -Multifactorial  Moderate obstructive sleep apnea -CPAP therapy will be appropriate -Auto CPAP 5-15  Abnormal CT showing bibasal groundglass changes, small lung nodule -May be related to air trapping -Follow-up CT scan ordered   Plan/Recommendations: Follow-up in about 3 months  DME referral for auto CPAP 5-15  Obtain full lung CT to assess for interstitial changes  Tentative follow-up in about 3 months  Encouraged to call with significant concerns   Jennet Epley MD [facility] 10/25/2023, 11:17 AM  CC: Ryan Reena DEL, PA

## 2023-10-31 ENCOUNTER — Ambulatory Visit: Payer: BC Managed Care – PPO | Admitting: Hematology and Oncology

## 2023-11-07 ENCOUNTER — Ambulatory Visit
Admission: RE | Admit: 2023-11-07 | Discharge: 2023-11-07 | Disposition: A | Discharge status: Home / Self Care | Source: Ambulatory Visit | Attending: Pulmonary Disease | Admitting: Pulmonary Disease

## 2023-11-07 ENCOUNTER — Other Ambulatory Visit: Payer: Self-pay | Admitting: Hematology and Oncology

## 2023-11-07 DIAGNOSIS — R911 Solitary pulmonary nodule: Secondary | ICD-10-CM

## 2023-11-19 ENCOUNTER — Telehealth: Payer: Self-pay

## 2023-11-19 NOTE — Telephone Encounter (Signed)
 Left message on voicemail about upcoming appointment

## 2023-11-20 ENCOUNTER — Inpatient Hospital Stay: Payer: Self-pay | Attending: Hematology and Oncology | Admitting: Hematology and Oncology

## 2023-11-20 VITALS — BP 148/60 | HR 90 | Temp 98.1°F | Resp 18 | Wt 179.8 lb

## 2023-11-20 DIAGNOSIS — C50912 Malignant neoplasm of unspecified site of left female breast: Secondary | ICD-10-CM

## 2023-11-20 DIAGNOSIS — Z170 Estrogen receptor positive status [ER+]: Secondary | ICD-10-CM

## 2023-11-20 NOTE — Progress Notes (Signed)
 [facility] CONSULT NOTE  Patient Care Team: Regino Slater, MD as PCP - General (Family Medicine) Curvin Deward MOULD, MD as Consulting Physician (General Surgery) Loretha Ash, MD as Consulting Physician (Hematology and Oncology) Dewey Rush, MD as Consulting Physician (Radiation Oncology)  CHIEF COMPLAINTS/PURPOSE OF CONSULTATION:  DCIS  ASSESSMENT & PLAN:   Assessment and Plan Assessment & Plan Malignant neoplasm of left breast, post-surgical, on tamoxifen  Post-surgical status with normal mammogram in January 2025.  Soreness at surgical site likely due to scar tissue, no new lumps. - Continue tamoxifen  at 5 mg daily since she reports ongoing issues with tamoxifen  - Schedule mammogram for January 2026, ordered.  Adverse effects of tamoxifen  therapy (hot flashes, dry mouth, weight gain) Experiencing side effects. Previous dose reduction ineffective. Discussed further reduction to 5 mg daily, supported by studies in DCIS. - Reduce tamoxifen  dose to 5 mg daily by taking a quarter of the current pill.  Obesity Actively losing weight through exercise and dietary changes. Recent weight loss of two pounds. - Continue exercise regimen and dietary modifications.   No orders of the defined types were placed in this encounter. HISTORY OF PRESENTING ILLNESS:   Frances Patton 58 y.o. 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 01/18/2021 diagnostic mammogram of the left breast showed suspicious area of distortion with calcifications in the lateral left breast, no evidence of left axillary lymphadenopathy. 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  Prior prognostics showed ER/PR positive tumor She is s/p adjuvant radiation.  Interval history  The patient, with a history of breast cancer, presents for a routine follow-up.   Discussed the use of AI scribe software for clinical note transcription with the patient, who gave verbal consent to proceed.  History of Present Illness Frances Patton is a 58 year old female with breast cancer who presents with side effects from tamoxifen  therapy.  She has been experiencing hot flashes, dry mouth, and weight gain as side effects of tamoxifen , which she has been taking since June 2023 following breast surgery and radiation. The hot flashes involve episodes of feeling cold, covering herself with a blanket, followed by sweating. The dry mouth improves with constant water intake.  She is actively managing her weight, having recently lost two pounds. She engages in exercise at the [facility] and has modified her diet to include more protein and water while reducing carbohydrate intake, particularly rice, which is a staple in her diet due to her [place] background.  She notes soreness at the site of her previous breast surgery, which is intermittent. She has been on tamoxifen  for approximately two and a half years, starting in June 2023, after her surgery in February 2023. A previous attempt to lower the dose of tamoxifen  did not alleviate her symptoms.    Wt Readings from Last 3 Encounters: 04/25/22 183 lb 1.6 oz (83.1 kg) 10/17/21 177 lb 9.6 oz (80.6 kg) 10/16/21 178 lb (80.7 kg)    MEDICAL HISTORY:  Past  Medical History:  Diagnosis Date   Cancer (HCC) 02/03/2021   Left breast DCIS   GERD  (gastroesophageal reflux disease)    Headache    PCOS (polycystic ovarian syndrome)    Personal history of radiation therapy     SURGICAL HISTORY: Past Surgical History:  Procedure Laterality Date   BREAST BIOPSY Left 01/26/2021   stereo biopsy/ x clip/ path pending   BREAST LUMPECTOMY     BREAST LUMPECTOMY WITH RADIOACTIVE SEED LOCALIZATION Left 02/24/2021   Procedure: LEFT BREAST LUMPECTOMY WITH RADIOACTIVE SEED LOCALIZATION;  Surgeon: Curvin Deward MOULD, MD;  Location: [facility];  Service: General;  Laterality: Left;   COLONOSCOPY      SOCIAL HISTORY: Social History   Socioeconomic History   Marital status: Married    Spouse name: Not on file   Number of children: Not on file   Years of education: Not on file   Highest education level: Not on file  Occupational History   Not on file  Tobacco Use   Smoking status: Never   Smokeless tobacco: Never  Vaping Use   Vaping status: Never Used  Substance and Sexual Activity   Alcohol use: Never   Drug use: Never   Sexual activity: Not on file  Other Topics Concern   Not on file  Social History Narrative   Not on file   Social Drivers of Health   Financial Resource Strain: Not on file  Food Insecurity: Not on file  Transportation Needs: Not on file  Physical Activity: Not on file  Stress: Not on file  Social Connections: Not on file  Intimate Partner Violence: Not on file    FAMILY HISTORY: Family History  Problem Relation Age of Onset   Breast cancer Mother    Prostate cancer Father    Mom had it in 60/70 Dad died from metastatic prostate cancer at 80.  ALLERGIES:  is allergic to latex.  MEDICATIONS:  Current Outpatient Medications  Medication Sig Dispense Refill   b complex vitamins capsule Take by mouth daily.     ibuprofen (ADVIL) 200 MG tablet Take by mouth.     magnesium 30 MG tablet Take by mouth 2 (two) times daily.     pantoprazole (PROTONIX) 40 MG tablet Take 40 mg by mouth daily.      pyridOXINE (VITAMIN B-6) 100 MG tablet Take 100 mg by mouth daily.     tamoxifen  (NOLVADEX ) 20 MG tablet TAKE 1 TABLET BY MOUTH EVERY DAY 90 tablet 1   VITAMIN D PO Take by mouth.     No current facility-administered medications for this visit.     PHYSICAL EXAMINATION: ECOG PERFORMANCE STATUS: 0 - Asymptomatic  Vitals:   11/20/23 1436  BP: (!) 148/60  Pulse: 90  Resp: 18  Temp: 98.1 F (36.7 C)  SpO2: 98%   Filed Weights   11/20/23 1436  Weight: 179 lb 12.8 oz (81.6 kg)   Physical Exam Constitutional:      Appearance: Normal appearance.  Chest:     Comments: Left breast with postop changes.  No palpable masses or regional adenopathy. Right breast normal to inspection and palpation Neurological:     Mental Status: She is alert.   At 444 LABORATORY DATA:  I have reviewed the data as listed No results found for: WBC, HGB, HCT, MCV, PLT   Chemistry   No results found for: NA, K, CL, CO2, BUN, CREATININE, GLU No results found for: CALCIUM, ALKPHOS, AST, ALT, BILITOT  RADIOGRAPHIC STUDIES: I have personally reviewed the radiological images as listed and agreed with the findings in the report. CT Chest Wo Contrast Result Date: 11/07/2023 EXAM: CT CHEST WITHOUT CONTRAST 11/07/2023 12:48:38 PM TECHNIQUE: CT of the chest was performed without the administration of intravenous contrast. Multiplanar reformatted images are provided for review. Automated exposure control, iterative reconstruction, and/or weight based adjustment of the mA/kV was utilized to reduce the radiation dose to as low as reasonably achievable. COMPARISON: 12/26/2021. CLINICAL HISTORY: Left lung nodule. Follow up for left lower lobe nodule; Patient is asymptomatic. FINDINGS: MEDIASTINUM: Heart and pericardium are unremarkable. The central airways are clear. Normal caliber thoracic aorta with mild atherosclerotic disease. LYMPH NODES: No mediastinal, hilar or axillary  lymphadenopathy. LUNGS AND PLEURA: Mild subpleural reticulation/scarring in the anterior left upper lobe, likely reflecting radiation changes. Mild linear scarring in the lingula. No suspicious pulmonary nodules. Specifically, no left lower lobe pulmonary nodule. No focal consolidation or pulmonary edema. No pleural effusion or pneumothorax. SOFT TISSUES/BONES: Suspected postprocedural changes related to left breast lumpectomy (image 65). A small mass in this region would be difficult to exclude on CT. Correlate with mammographic imaging as clinically warranted. No acute abnormality of the bones. UPPER ABDOMEN: Cholelithiasis. Low-attenuation liver lesions, likely simple cysts. Per consensus, no follow-up is needed for simple Bosniak type 1 and 2 renal cysts, unless the patient has a malignancy history or risk factors. Limited images of the upper abdomen demonstrates no other acute abnormality. IMPRESSION: 1. No suspicious pulmonary nodules. Specifically, no left lower lobe pulmonary nodule. 2. Suspected changes related to left breast lumpectomy, although a small mass in this region would be difficult to exclude on CT. Correlate with mammographic imaging as clinically warranted. 3. Radiation changes in the left upper lobe. No findings suspicious for metastatic disease. Electronically signed by: Pinkie Pebbles MD 11/07/2023 02:05 PM EDT RP Workstation: [id number]    All questions were answered. The patient knows to call the clinic with any problems, questions or concerns. I spent 30 minutes in the care of this patient including H and P, review of records, counseling and coordination of care.     Amber Stalls, MD 11/20/2023 3:10 PM

## 2023-11-26 ENCOUNTER — Encounter: Payer: Self-pay | Admitting: Pulmonary Disease

## 2023-11-26 ENCOUNTER — Ambulatory Visit (INDEPENDENT_AMBULATORY_CARE_PROVIDER_SITE_OTHER): Admitting: Pulmonary Disease

## 2023-11-26 VITALS — BP 138/71 | HR 89 | Temp 98.1°F | Ht 65.0 in | Wt 173.0 lb

## 2023-11-26 DIAGNOSIS — R911 Solitary pulmonary nodule: Secondary | ICD-10-CM

## 2023-11-26 NOTE — Patient Instructions (Signed)
 Your CT scan looks good  The scarring on the top of the lung is likely due to radiation changes previously  No other significant abnormality  Follow-up in about a year  Call us  with significant concerns

## 2023-11-26 NOTE — Progress Notes (Signed)
 Frances Patton    [id number]    [birth year]  Primary Care Physician:Koirala, Dibas, MD  Referring Physician: Regino Slater, MD [address] [place],  [place] [address]  Chief complaint:   Follow-up for abnormal CT In for follow-up visit today  HPI:  Patient was last seen about a month ago for an abnormal CT scan She had a CT scan which was reviewed with her today showing some scarring in the left upper lobe stable from previous  She has a history of moderate obstructive sleep apnea but has not been using CPAP she feels better overall with weight loss, she does not feel she needs to use CPAP any longer  Denies any significant complaints Denies any cough denies any significant shortness of breath Has been trying to stay active  She does have a history of breast cancer for which she had radiation treatments  She was not able to follow-up with a CT previously because of loss of insurance Never smoker No pertinent occupational history/predisposition to lung disease  She did have asthma growing up  Does have occasional reflux symptoms  Outpatient Encounter Medications as of 11/26/2023  Medication Sig   b complex vitamins capsule Take by mouth daily.   ibuprofen (ADVIL) 200 MG tablet Take by mouth.   magnesium 30 MG tablet Take by mouth 2 (two) times daily.   pantoprazole (PROTONIX) 40 MG tablet Take 40 mg by mouth daily.   pyridOXINE (VITAMIN B-6) 100 MG tablet Take 100 mg by mouth daily.   tamoxifen  (NOLVADEX ) 20 MG tablet TAKE 1 TABLET BY MOUTH EVERY DAY   VITAMIN D PO Take by mouth.   No facility-administered encounter medications on file as of 11/26/2023.    Allergies as of 11/26/2023 - Review Complete 11/26/2023  Allergen Reaction Noted   Latex Itching 02/15/2021    Past Medical History:  Diagnosis Date   Cancer (HCC) 02/03/2021   Left breast DCIS   GERD (gastroesophageal reflux disease)    Headache    PCOS (polycystic ovarian syndrome)     Personal history of radiation therapy     Past Surgical History:  Procedure Laterality Date   BREAST BIOPSY Left 01/26/2021   stereo biopsy/ x clip/ path pending   BREAST LUMPECTOMY     BREAST LUMPECTOMY WITH RADIOACTIVE SEED LOCALIZATION Left 02/24/2021   Procedure: LEFT BREAST LUMPECTOMY WITH RADIOACTIVE SEED LOCALIZATION;  Surgeon: Curvin Deward MOULD, MD;  Location: [facility];  Service: General;  Laterality: Left;   COLONOSCOPY      Family History  Problem Relation Age of Onset   Breast cancer Mother    Prostate cancer Father     Social History   Socioeconomic History   Marital status: Married    Spouse name: Not on file   Number of children: Not on file   Years of education: Not on file   Highest education level: Not on file  Occupational History   Not on file  Tobacco Use   Smoking status: Never   Smokeless tobacco: Never  Vaping Use   Vaping status: Never Used  Substance and Sexual Activity   Alcohol use: Never   Drug use: Never   Sexual activity: Not on file  Other Topics Concern   Not on file  Social History Narrative   Not on file   Social Drivers of Health   Financial Resource Strain: Not on file  Food Insecurity: Not on file  Transportation Needs: Not on file  Physical Activity: Not on file  Stress: Not on file  Social Connections: Not on file  Intimate Partner Violence: Not on file    Review of Systems  Constitutional:  Negative for fatigue.  Respiratory:  Negative for cough and shortness of breath.   Psychiatric/Behavioral:  Positive for sleep disturbance.     Vitals:   11/26/23 1323  BP: 138/71  Pulse: 89  Temp: 98.1 F (36.7 C)  SpO2: 97%     Physical Exam Constitutional:      Appearance: Normal appearance.  HENT:     Head: Normocephalic.     Mouth/Throat:     Mouth: Mucous membranes are moist.  Eyes:     General: No scleral icterus. Cardiovascular:     Rate and Rhythm: Normal rate and regular rhythm.      Heart sounds: No murmur heard.    No friction rub.  Pulmonary:     Effort: No respiratory distress.     Breath sounds: No stridor. No wheezing or rhonchi.  Musculoskeletal:     Cervical back: No rigidity.  Neurological:     Mental Status: She is alert.  Psychiatric:        Mood and Affect: Mood normal.    Data Reviewed:  CT chest reviewed with the patient today, stable from previous  Sleep study with AHI of 22 with mild oxygen desaturations  Previous PFT with mild restriction with normal diffusing capacity  Assessment:  Abnormal CT scan of the chest showing a left lung nodule, some atelectatic changes - Stable from previous  History of breast cancer for which she had radiation treatments  Obstructive sleep apnea No longer using CPAP therapy - Does not feel she was benefiting from it so not going to go back to using it  Plan/Recommendations: Follow-up a year from now  I do not believe we need a repeat CT scan at present  Encourage regular exercises  Encourage weight loss measures  Encouraged to call us  with any significant concerns   Jennet Epley MD [facility] 11/26/2023, 1:50 PM  CC: Regino Slater, MD

## 2024-01-23 ENCOUNTER — Other Ambulatory Visit: Payer: Self-pay | Admitting: Gastroenterology

## 2024-01-23 DIAGNOSIS — R109 Unspecified abdominal pain: Secondary | ICD-10-CM

## 2024-01-23 DIAGNOSIS — R110 Nausea: Secondary | ICD-10-CM

## 2024-01-31 ENCOUNTER — Ambulatory Visit
Admission: RE | Admit: 2024-01-31 | Discharge: 2024-01-31 | Disposition: A | Discharge status: Home / Self Care | Source: Ambulatory Visit | Attending: Hematology and Oncology | Admitting: Hematology and Oncology

## 2024-01-31 DIAGNOSIS — C50912 Malignant neoplasm of unspecified site of left female breast: Secondary | ICD-10-CM

## 2024-02-07 ENCOUNTER — Ambulatory Visit
Admission: RE | Admit: 2024-02-07 | Discharge: 2024-02-07 | Disposition: A | Source: Ambulatory Visit | Attending: Gastroenterology

## 2024-02-07 DIAGNOSIS — R109 Unspecified abdominal pain: Secondary | ICD-10-CM

## 2024-02-07 DIAGNOSIS — R110 Nausea: Secondary | ICD-10-CM

## 2024-11-20 ENCOUNTER — Inpatient Hospital Stay: Admitting: Hematology and Oncology
